# Patient Record
Sex: Male | Born: 1965 | Race: White | Hispanic: No | Smoking: Former smoker
Health system: Southern US, Community
[De-identification: ages and names within clinical notes are randomized; demographics above are authoritative.]

## PROBLEM LIST (undated history)

## (undated) DIAGNOSIS — J45909 Unspecified asthma, uncomplicated: Secondary | ICD-10-CM

## (undated) DIAGNOSIS — I10 Essential (primary) hypertension: Secondary | ICD-10-CM

## (undated) DIAGNOSIS — I739 Peripheral vascular disease, unspecified: Secondary | ICD-10-CM

## (undated) DIAGNOSIS — L89159 Pressure ulcer of sacral region, unspecified stage: Secondary | ICD-10-CM

## (undated) DIAGNOSIS — E119 Type 2 diabetes mellitus without complications: Secondary | ICD-10-CM

## (undated) DIAGNOSIS — N200 Calculus of kidney: Secondary | ICD-10-CM

## (undated) DIAGNOSIS — K859 Acute pancreatitis without necrosis or infection, unspecified: Secondary | ICD-10-CM

## (undated) DIAGNOSIS — F329 Major depressive disorder, single episode, unspecified: Secondary | ICD-10-CM

## (undated) DIAGNOSIS — F419 Anxiety disorder, unspecified: Secondary | ICD-10-CM

## (undated) DIAGNOSIS — E785 Hyperlipidemia, unspecified: Secondary | ICD-10-CM

## (undated) DIAGNOSIS — K219 Gastro-esophageal reflux disease without esophagitis: Secondary | ICD-10-CM

## (undated) DIAGNOSIS — F1010 Alcohol abuse, uncomplicated: Secondary | ICD-10-CM

## (undated) DIAGNOSIS — F4310 Post-traumatic stress disorder, unspecified: Secondary | ICD-10-CM

## (undated) DIAGNOSIS — F32A Depression, unspecified: Secondary | ICD-10-CM

## (undated) DIAGNOSIS — L03211 Cellulitis of face: Secondary | ICD-10-CM

## (undated) DIAGNOSIS — E1110 Type 2 diabetes mellitus with ketoacidosis without coma: Secondary | ICD-10-CM

## (undated) DIAGNOSIS — G2581 Restless legs syndrome: Secondary | ICD-10-CM

## (undated) DIAGNOSIS — E870 Hyperosmolality and hypernatremia: Secondary | ICD-10-CM

## (undated) DIAGNOSIS — I96 Gangrene, not elsewhere classified: Secondary | ICD-10-CM

## (undated) HISTORY — PX: OTHER SURGICAL HISTORY: SHX169

## (undated) HISTORY — PX: CHOLECYSTECTOMY: SHX55

## (undated) HISTORY — PX: COLONOSCOPY: SHX174

## (undated) HISTORY — PX: PANCREATECTOMY: SHX1019

## (undated) HISTORY — PX: INCISION AND DRAINAGE PERIRECTAL ABSCESS: SHX1804

---

## 2013-09-05 ENCOUNTER — Non-Acute Institutional Stay (SKILLED_NURSING_FACILITY): Payer: Medicare Other | Admitting: Internal Medicine

## 2013-09-05 ENCOUNTER — Other Ambulatory Visit: Payer: Self-pay

## 2013-09-05 DIAGNOSIS — K750 Abscess of liver: Secondary | ICD-10-CM

## 2013-09-05 DIAGNOSIS — L8994 Pressure ulcer of unspecified site, stage 4: Secondary | ICD-10-CM

## 2013-09-05 DIAGNOSIS — L8990 Pressure ulcer of unspecified site, unspecified stage: Secondary | ICD-10-CM

## 2013-09-05 DIAGNOSIS — K769 Liver disease, unspecified: Secondary | ICD-10-CM

## 2013-09-05 MED ORDER — LORAZEPAM 0.5 MG PO TABS: 0.5000 mg | ORAL_TABLET | Freq: Three times a day (TID) | ORAL | Status: DC

## 2013-09-05 MED ORDER — TRAMADOL HCL 50 MG PO TABS: 50.0000 mg | ORAL_TABLET | Freq: Three times a day (TID) | ORAL | Status: DC | PRN

## 2013-09-05 NOTE — Progress Notes (Signed)
Patient ID: Florian Chauca, male   DOB: 1966/03/30, 47 y.o.   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 He was apparently in a patient who was transferred possibly from hospital in [place] where the patient lives. He was admitted in July 3 with diabetic ketoacidosis likely triggered by multiple liver abscesses. He developed multiorgan failure requiring endotracheal intubation pressor support and subsequently discharged on August 8 I believe to [facility]. There he had BiPAP dependent respiratory, prolonged IV antibiotics vancomycin, cefepime and metronidazole drains of the liver abscesses were removed the. He was felt to have mild alcohol induced dementia and possible cirrhosis. He apparently was taken to the OR for debridement of a gangrenous necrotic sacral decubitus ulcer on August 14. I am reviewing him today because of this  Physical examination; Skin; there is a substantial stage IV wound over his sacrum. The base of this has some adherent eschar and probably some exposed tendon. I do not feel any bone however there is a considerable amount of undermining several inches towards the left. There is a small satellite wound at roughly 4:00. This has hypergranular the tissue. He has dry gangrene in the tip of his left index finger. There is no infection here the nature of this is not completely clear presumably embolic. Respiratory; clear entry bilaterally no crackles or wheezes Cardiac; heart sounds or tachycardic but no murmurs no gallops he appears to be euvolemic Abdomen; he has a PEG tube in place which apparently they're not using there is a fullness in the right upper quadrant although I could not exactly feel a liver edge. No spleen Neurologic; absent knee jerks are noted he has nonsustained clonus at the left ankle jerk and bilateral extensor plantars. The patient  is tremulous. But I did not note any asterixis Mental status; he remembers nothing about his previous hospitalization or how he got to [facility]. He is aware he is in a nursing home in [place].  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 He should receive santyl the base of this with a moist gauze. 2 the small satellite area I would use silver alginate. Ultimately this may be amenable to a wound VAC. #2 diabetes; the patient states he has been on insulin since his teens therefore I wonder whether he is a type II or type I diabetic although I note that he is on metformin #3 liver abscesses. CT scan done provide a prior to removal of his drains showed the abscess to be gone but a questionable spot in the undersurface of the liver. He has completed his antibiotics Cipro #4 obstructive sleep apnea apparently had a sleep study with results pending

## 2013-09-05 NOTE — Telephone Encounter (Signed)
Verified dose and instructions reflect manual request received by nursing home.   

## 2013-09-08 ENCOUNTER — Non-Acute Institutional Stay (SKILLED_NURSING_FACILITY): Payer: Medicare Other | Admitting: Internal Medicine

## 2013-09-08 ENCOUNTER — Other Ambulatory Visit: Payer: Self-pay | Admitting: *Deleted

## 2013-09-08 DIAGNOSIS — K7030 Alcoholic cirrhosis of liver without ascites: Secondary | ICD-10-CM

## 2013-09-08 DIAGNOSIS — J45909 Unspecified asthma, uncomplicated: Secondary | ICD-10-CM

## 2013-09-08 DIAGNOSIS — E119 Type 2 diabetes mellitus without complications: Secondary | ICD-10-CM

## 2013-09-08 DIAGNOSIS — E7800 Pure hypercholesterolemia, unspecified: Secondary | ICD-10-CM

## 2013-09-08 MED ORDER — LORAZEPAM 0.5 MG PO TABS: 0.5000 mg | ORAL_TABLET | Freq: Three times a day (TID) | ORAL | Status: DC

## 2013-09-10 ENCOUNTER — Emergency Department (HOSPITAL_COMMUNITY)
Admission: EM | Admit: 2013-09-10 | Discharge: 2013-09-10 | Disposition: A | Discharge status: Home / Self Care | Payer: Medicare Other | Attending: Emergency Medicine | Admitting: Emergency Medicine

## 2013-09-10 ENCOUNTER — Encounter (HOSPITAL_COMMUNITY): Payer: Self-pay | Admitting: *Deleted

## 2013-09-10 ENCOUNTER — Emergency Department (HOSPITAL_COMMUNITY): Payer: Medicare Other

## 2013-09-10 DIAGNOSIS — Z7982 Long term (current) use of aspirin: Secondary | ICD-10-CM | POA: Insufficient documentation

## 2013-09-10 DIAGNOSIS — F3289 Other specified depressive episodes: Secondary | ICD-10-CM | POA: Insufficient documentation

## 2013-09-10 DIAGNOSIS — I10 Essential (primary) hypertension: Secondary | ICD-10-CM | POA: Insufficient documentation

## 2013-09-10 DIAGNOSIS — Z8669 Personal history of other diseases of the nervous system and sense organs: Secondary | ICD-10-CM | POA: Insufficient documentation

## 2013-09-10 DIAGNOSIS — N390 Urinary tract infection, site not specified: Secondary | ICD-10-CM | POA: Diagnosis not present

## 2013-09-10 DIAGNOSIS — F411 Generalized anxiety disorder: Secondary | ICD-10-CM | POA: Insufficient documentation

## 2013-09-10 DIAGNOSIS — E1110 Type 2 diabetes mellitus with ketoacidosis without coma: Secondary | ICD-10-CM | POA: Diagnosis not present

## 2013-09-10 DIAGNOSIS — J45909 Unspecified asthma, uncomplicated: Secondary | ICD-10-CM | POA: Insufficient documentation

## 2013-09-10 DIAGNOSIS — E785 Hyperlipidemia, unspecified: Secondary | ICD-10-CM | POA: Insufficient documentation

## 2013-09-10 DIAGNOSIS — R109 Unspecified abdominal pain: Secondary | ICD-10-CM | POA: Diagnosis present

## 2013-09-10 DIAGNOSIS — Z79899 Other long term (current) drug therapy: Secondary | ICD-10-CM | POA: Insufficient documentation

## 2013-09-10 DIAGNOSIS — F329 Major depressive disorder, single episode, unspecified: Secondary | ICD-10-CM | POA: Diagnosis not present

## 2013-09-10 DIAGNOSIS — Z87442 Personal history of urinary calculi: Secondary | ICD-10-CM | POA: Insufficient documentation

## 2013-09-10 DIAGNOSIS — Z872 Personal history of diseases of the skin and subcutaneous tissue: Secondary | ICD-10-CM | POA: Insufficient documentation

## 2013-09-10 DIAGNOSIS — R112 Nausea with vomiting, unspecified: Secondary | ICD-10-CM | POA: Diagnosis not present

## 2013-09-10 DIAGNOSIS — Z794 Long term (current) use of insulin: Secondary | ICD-10-CM | POA: Insufficient documentation

## 2013-09-10 DIAGNOSIS — Z8719 Personal history of other diseases of the digestive system: Secondary | ICD-10-CM | POA: Insufficient documentation

## 2013-09-10 HISTORY — DX: Peripheral vascular disease, unspecified: I73.9

## 2013-09-10 HISTORY — DX: Pressure ulcer of sacral region, unspecified stage: L89.159

## 2013-09-10 HISTORY — DX: Acute pancreatitis without necrosis or infection, unspecified: K85.90

## 2013-09-10 HISTORY — DX: Type 2 diabetes mellitus with ketoacidosis without coma: E11.10

## 2013-09-10 HISTORY — DX: Hyperosmolality and hypernatremia: E87.0

## 2013-09-10 HISTORY — DX: Alcohol abuse, uncomplicated: F10.10

## 2013-09-10 HISTORY — DX: Calculus of kidney: N20.0

## 2013-09-10 HISTORY — DX: Hyperlipidemia, unspecified: E78.5

## 2013-09-10 HISTORY — DX: Type 2 diabetes mellitus without complications: E11.9

## 2013-09-10 HISTORY — DX: Restless legs syndrome: G25.81

## 2013-09-10 HISTORY — DX: Post-traumatic stress disorder, unspecified: F43.10

## 2013-09-10 HISTORY — DX: Major depressive disorder, single episode, unspecified: F32.9

## 2013-09-10 HISTORY — DX: Unspecified asthma, uncomplicated: J45.909

## 2013-09-10 HISTORY — DX: Depression, unspecified: F32.A

## 2013-09-10 HISTORY — DX: Anxiety disorder, unspecified: F41.9

## 2013-09-10 HISTORY — DX: Cellulitis of face: L03.211

## 2013-09-10 HISTORY — DX: Gangrene, not elsewhere classified: I96

## 2013-09-10 HISTORY — DX: Essential (primary) hypertension: I10

## 2013-09-10 HISTORY — DX: Gastro-esophageal reflux disease without esophagitis: K21.9

## 2013-09-10 LAB — URINALYSIS, ROUTINE W REFLEX MICROSCOPIC
Ketones, ur: NEGATIVE mg/dL
Nitrite: NEGATIVE
Protein, ur: NEGATIVE mg/dL
Urobilinogen, UA: 0.2 mg/dL (ref 0.0–1.0)

## 2013-09-10 LAB — CBC WITH DIFFERENTIAL/PLATELET
Basophils Absolute: 0 10*3/uL (ref 0.0–0.1)
Basophils Relative: 0 % (ref 0–1)
Eosinophils Relative: 1 % (ref 0–5)
HCT: 35.6 % — ABNORMAL LOW (ref 39.0–52.0)
Hemoglobin: 12.1 g/dL — ABNORMAL LOW (ref 13.0–17.0)
Lymphocytes Relative: 8 % — ABNORMAL LOW (ref 12–46)
MCHC: 34 g/dL (ref 30.0–36.0)
MCV: 81.3 fL (ref 78.0–100.0)
Monocytes Absolute: 0.7 10*3/uL (ref 0.1–1.0)
Monocytes Relative: 6 % (ref 3–12)
Neutro Abs: 8.8 10*3/uL — ABNORMAL HIGH (ref 1.7–7.7)
RDW: 15.4 % (ref 11.5–15.5)

## 2013-09-10 LAB — COMPREHENSIVE METABOLIC PANEL
BUN: 44 mg/dL — ABNORMAL HIGH (ref 6–23)
CO2: 29 mEq/L (ref 19–32)
Calcium: 10.1 mg/dL (ref 8.4–10.5)
Chloride: 94 mEq/L — ABNORMAL LOW (ref 96–112)
Creatinine, Ser: 1.18 mg/dL (ref 0.50–1.35)
GFR calc non Af Amer: 71 mL/min — ABNORMAL LOW (ref >90)
Total Bilirubin: 1 mg/dL (ref 0.3–1.2)

## 2013-09-10 LAB — LIPASE, BLOOD: Lipase: 12 U/L (ref 11–59)

## 2013-09-10 LAB — URINE MICROSCOPIC-ADD ON

## 2013-09-10 MED ORDER — ONDANSETRON 8 MG PO TBDP: 8.0000 mg | ORAL_TABLET | Freq: Three times a day (TID) | ORAL | Status: DC | PRN

## 2013-09-10 MED ORDER — ONDANSETRON HCL 4 MG/2ML IJ SOLN
4.0000 mg | Freq: Once | INTRAMUSCULAR | Status: AC
  Administered 2013-09-10: 4 mg via INTRAVENOUS
  Filled 2013-09-10: qty 2

## 2013-09-10 MED ORDER — SODIUM CHLORIDE 0.9 % IV SOLN
1000.0000 mL | Freq: Once | INTRAVENOUS | Status: AC
  Administered 2013-09-10: 1000 mL via INTRAVENOUS

## 2013-09-10 MED ORDER — FENTANYL CITRATE 0.05 MG/ML IJ SOLN
50.0000 ug | Freq: Once | INTRAMUSCULAR | Status: AC
  Administered 2013-09-10: 50 ug via INTRAVENOUS
  Filled 2013-09-10: qty 2

## 2013-09-10 MED ORDER — SODIUM CHLORIDE 0.9 % IV SOLN
1000.0000 mL | INTRAVENOUS | Status: DC
  Administered 2013-09-10: 1000 mL via INTRAVENOUS

## 2013-09-10 MED ORDER — CEFUROXIME AXETIL 250 MG PO TABS: 250.0000 mg | ORAL_TABLET | Freq: Two times a day (BID) | ORAL | Status: DC

## 2013-09-10 MED ORDER — DEXTROSE 5 % IV SOLN
1.0000 g | INTRAVENOUS | Status: DC
  Administered 2013-09-10: 1 g via INTRAVENOUS
  Filled 2013-09-10: qty 10

## 2013-09-10 NOTE — ED Provider Notes (Signed)
CSN: [id number]     Arrival date & time 09/10/13  1553 History  First MD Initiated Contact with Patient 09/10/13 1615     Chief Complaint  Patient presents with  . Abdominal Pain  . feeding tube    HPI Patient presents to the emergency room with complaints of abdominal pain. Patient is very complicated medical history. He had been in mid to [facility] in diabetic ketoacidosis. He also had colitis and developed multiple liver abscesses. The patient was intubated and was in the intensive care unit. He developed acute kidney injury, see different healthcare associated pneumonia. Patient was eventually transferred to [facility].  He was recently been transferred to [facility]ursing facility. Starting yesterday the patient began having pain in his left upper abdomen. He has had some episodes of nausea and vomiting as well.  The pain is severe. It does not radiate. He has not noticed any trouble with constipation or diarrhea. He does notice some discomfort when he urinates.  Past Medical History  Diagnosis Date  . ETOH abuse   . Pancreatitis   . Diabetes mellitus without complication   . GERD (gastroesophageal reflux disease)   . Restless leg syndrome   . Anxiety   . Depression   . PTSD (post-traumatic stress disorder)   . Dyslipidemia   . Asthma   . PVD (peripheral vascular disease)   . Facial cellulitis   . DKA (diabetic ketoacidoses)     hx of  . Kidney stones   . Necrotic ulceration of fingers     left index finger  . Hypertension   . Hypernatremia   . Sacral decubitus ulcer    Past Surgical History  Procedure Laterality Date  . Colonoscopy      2008  . Left inguinal hernia repair    . Cholecystectomy    . Incision and drainage perirectal abscess     History reviewed. No pertinent family history. History  Substance Use Topics  . Smoking status: Never Smoker   . Smokeless tobacco: Not on file  . Alcohol Use: Yes     Comment: hx of etoh abuse     Review of Systems  All other systems reviewed and are negative.    Allergies  Dilaudid  Home Medications   Current Outpatient Rx  Name  Route  Sig  Dispense  Refill  . aspirin 81 MG chewable tablet   Oral   Chew 81 mg by mouth daily.         Marland Kitchen atorvastatin (LIPITOR) 10 MG tablet   Oral   Take 10 mg by mouth daily.         . enalapril (VASOTEC) 10 MG tablet   Oral   Take 10 mg by mouth daily.         Marland Kitchen enoxaparin (LOVENOX) 40 MG/0.4ML injection   Subcutaneous   Inject 40 mg into the skin daily.         . feeding supplement (PRO-STAT SUGAR FREE 64) LIQD   Oral   Take 30 mLs by mouth 2 (two) times daily.         . ferrous sulfate 325 (65 FE) MG tablet   Oral   Take 325 mg by mouth daily with breakfast.         . insulin aspart (NOVOLOG) 100 UNIT/ML injection   Subcutaneous   Inject into the skin 3 (three) times daily with meals. 5 units if greater than 150         .  insulin detemir (LEVEMIR) 100 UNIT/ML injection   Subcutaneous   Inject 35 Units into the skin at bedtime.         Marland Kitchen LORazepam (ATIVAN) 0.5 MG tablet   Oral   Take 1 tablet (0.5 mg total) by mouth every 8 (eight) hours.   90 tablet   5   . metFORMIN (GLUCOPHAGE) 500 MG tablet   Oral   Take 500 mg by mouth 2 (two) times daily with a meal.         . metoprolol (LOPRESSOR) 50 MG tablet   Oral   Take 50 mg by mouth 2 (two) times daily.         . Multiple Vitamin (MULTIVITAMIN WITH MINERALS) TABS tablet   Oral   Take 1 tablet by mouth daily.         . QUEtiapine (SEROQUEL) 50 MG tablet   Oral   Take 50 mg by mouth at bedtime.         . Skin Protectants, Misc. (EUCERIN) cream   Topical   Apply 1 application topically as needed for dry skin.         Marland Kitchen thiamine 100 MG tablet   Oral   Take 100 mg by mouth daily.         . vitamin C (ASCORBIC ACID) 500 MG tablet   Oral   Take 500 mg by mouth daily.         Marland Kitchen zinc sulfate 220 MG capsule   Oral   Take 220  mg by mouth daily.         . cefUROXime (CEFTIN) 250 MG tablet   Oral   Take 1 tablet (250 mg total) by mouth 2 (two) times daily.   14 tablet   0   . ondansetron (ZOFRAN ODT) 8 MG disintegrating tablet   Oral   Take 1 tablet (8 mg total) by mouth every 8 (eight) hours as needed for nausea.   20 tablet   0   . traMADol (ULTRAM) 50 MG tablet   Oral   Take 1 tablet (50 mg total) by mouth every 8 (eight) hours as needed for pain.   90 tablet   5    BP 110/70  Pulse 91  Temp(Src) 98 F (36.7 C) (Oral)  Resp 18  SpO2 97% Physical Exam  Nursing note and vitals reviewed. Constitutional: No distress.  Chronically ill-appearing  HENT:  Head: Normocephalic and atraumatic.  Right Ear: External ear normal.  Left Ear: External ear normal.  Eyes: Conjunctivae are normal. Right eye exhibits no discharge. Left eye exhibits no discharge. No scleral icterus.  Neck: Neck supple. No tracheal deviation present.  Cardiovascular: Normal rate, regular rhythm and intact distal pulses.   Pulmonary/Chest: Effort normal and breath sounds normal. No stridor. No respiratory distress. He has no wheezes. He has no rales.  Abdominal: Soft. Bowel sounds are normal. He exhibits no distension. There is tenderness in the left upper quadrant and left lower quadrant. There is no rebound and no guarding.  Musculoskeletal: He exhibits no edema and no tenderness.  Stage IV sacral decubitus ulcer, no purulent drainage, no surrounding erythema  Neurological: He is alert. He has normal strength. He displays tremor. No sensory deficit. Cranial nerve deficit:  no gross defecits noted. He exhibits normal muscle tone. He displays no seizure activity. Coordination normal. GCS eye subscore is 4. GCS verbal subscore is 5. GCS motor subscore is 6.  Generalized weakness  Skin: Skin is  warm and dry. No rash noted.  Psychiatric: He has a normal mood and affect.    ED Course  Procedures (including critical care  time) Labs Review Labs Reviewed  CBC WITH DIFFERENTIAL - Abnormal; Notable for the following:    Hemoglobin 12.1 (*)    HCT 35.6 (*)    Neutrophils Relative % 85 (*)    Neutro Abs 8.8 (*)    Lymphocytes Relative 8 (*)    All other components within normal limits  COMPREHENSIVE METABOLIC PANEL - Abnormal; Notable for the following:    Chloride 94 (*)    Glucose, Bld 172 (*)    BUN 44 (*)    Albumin 3.0 (*)    GFR calc non Af Amer 71 (*)    GFR calc Af Amer 83 (*)    All other components within normal limits  URINALYSIS, ROUTINE W REFLEX MICROSCOPIC - Abnormal; Notable for the following:    Color, Urine AMBER (*)    APPearance TURBID (*)    Hgb urine dipstick SMALL (*)    Leukocytes, UA LARGE (*)    All other components within normal limits  URINE MICROSCOPIC-ADD ON - Abnormal; Notable for the following:    Bacteria, UA FEW (*)    All other components within normal limits  URINE CULTURE  LIPASE, BLOOD   Imaging Review Dg Abd Acute W/chest  09/10/2013   CLINICAL DATA:  Abdominal pain  EXAM: ACUTE ABDOMEN SERIES (ABDOMEN 2 VIEW & CHEST 1 VIEW)  COMPARISON:  None.  FINDINGS: Linear scarring or subsegmental atelectasis in the lung bases. No effusion. Heart size normal.  No free air. Gastrostomy tube projects in the left upper abdomen. Several gas distended left mid abdominal small bowel loops. Moderate fecal material in the proximal colon, decompressed distally. No abnormal abdominal calcifications. Regional bones unremarkable.  IMPRESSION: Nonobstructive bowel gas pattern with moderate proximal colonic fecal material.  Gastrostomy tube projects in expected location.  Linear scar or atelectasis in the lung bases.   Electronically Signed   By: Oley Balm M.D.   On: 09/10/2013 18:12   Medications  0.9 %  sodium chloride infusion (0 mLs Intravenous Stopped 09/10/13 1818)    Followed by  0.9 %  sodium chloride infusion (1,000 mLs Intravenous New Bag/Given 09/10/13 1830)  cefTRIAXone  (ROCEPHIN) 1 g in dextrose 5 % 50 mL IVPB (1 g Intravenous New Bag/Given 09/10/13 1827)  ondansetron (ZOFRAN) injection 4 mg (4 mg Intravenous Given 09/10/13 1655)  fentaNYL (SUBLIMAZE) injection 50 mcg (50 mcg Intravenous Given 09/10/13 1655)    MDM   1. Urinary tract infection    The patient's x-rays did not show any evidence of bowel obstruction. His laboratory tests are consistent with mild dehydration. He was given IV fluids here in the emergency department. Patient's urinalysis show a urinary tract infection. I think this is the cause of his abdominal pain and dysuria.   Patient was given a dose of IV antibiotics. I will discharge him back to the nursing facility with a prescription for Ceftin.    Celene Kras, MD 09/10/13 [phone]

## 2013-09-10 NOTE — ED Notes (Signed)
Bed: WA16 Expected date:  Expected time:  Means of arrival:  Comments: EMS 

## 2013-09-10 NOTE — ED Notes (Addendum)
Per [facility] pt from [facility], room 124. pt reports nausea, vomiting, and abdominal pain since yesterday. Pt reports pain since feeding tube placed in left quadrant 1 week ago (unsure if feeding tube or JP drain). Pt reports foley catheter was removed last week as well. Abdominal pain 10/10. Hx of ETOH, pancreatitis, GERD, anxiety, asthma, PVD, cellulitis. Pt in diaper at present.   Paper work history and physical state pt was admitted to [facility] in DKA, with colitis and multiple liver abscesses. Pt was intubated in ED and eventually extubated. develoepd acute kidney injury, c diff, and healthcare associated pneumonia. JP drain.

## 2013-09-12 ENCOUNTER — Other Ambulatory Visit: Payer: Self-pay | Admitting: *Deleted

## 2013-09-12 LAB — URINE CULTURE: Colony Count: 35000

## 2013-09-12 MED ORDER — LORAZEPAM 0.5 MG PO TABS: 0.5000 mg | ORAL_TABLET | Freq: Three times a day (TID) | ORAL | Status: DC

## 2013-09-15 ENCOUNTER — Non-Acute Institutional Stay (SKILLED_NURSING_FACILITY): Payer: Medicare Other | Admitting: Internal Medicine

## 2013-09-15 DIAGNOSIS — N179 Acute kidney failure, unspecified: Secondary | ICD-10-CM

## 2013-09-15 DIAGNOSIS — D7289 Other specified disorders of white blood cells: Secondary | ICD-10-CM

## 2013-09-15 DIAGNOSIS — D638 Anemia in other chronic diseases classified elsewhere: Secondary | ICD-10-CM

## 2013-09-18 ENCOUNTER — Non-Acute Institutional Stay (SKILLED_NURSING_FACILITY): Payer: Medicare Other | Admitting: Internal Medicine

## 2013-09-18 DIAGNOSIS — N179 Acute kidney failure, unspecified: Secondary | ICD-10-CM

## 2013-09-18 DIAGNOSIS — R630 Anorexia: Secondary | ICD-10-CM

## 2013-09-18 DIAGNOSIS — D638 Anemia in other chronic diseases classified elsewhere: Secondary | ICD-10-CM

## 2013-09-19 DIAGNOSIS — R630 Anorexia: Secondary | ICD-10-CM | POA: Insufficient documentation

## 2013-09-19 DIAGNOSIS — D638 Anemia in other chronic diseases classified elsewhere: Secondary | ICD-10-CM | POA: Insufficient documentation

## 2013-09-19 DIAGNOSIS — N179 Acute kidney failure, unspecified: Secondary | ICD-10-CM | POA: Insufficient documentation

## 2013-09-19 NOTE — Progress Notes (Signed)
PROGRESS NOTE  DATE: 09/18/2013  FACILITY:  [facility]  LEVEL OF CARE: SNF (31)  Acute Visit  CHIEF COMPLAINT:  Manage anorexia, anemia of chronic disease and acute renal failure  HISTORY OF PRESENT ILLNESS: I was requested by the staff to assess the patient regarding above problem(s):  ANOREXIA: Staff reports that patient has no appetite and he is not eating. The patient in is not a new problem. He has a history of alcohol abuse.  ANEMIA: The anemia is unstable. The patient denies melena or hematochezia. No complications from the medications currently being used. On 09-16-13 hemoglobin 10.2, MCV 80. On 09-09-13 hemoglobin 12.5.  ARF: 19-23-14 BUN 45, creatinine 1.29. On 09-09-13 BUN 39, creatinine 1.63. Patient denies confusion or increasing lower extremity swelling.  PAST MEDICAL HISTORY : Reviewed.  No changes.  CURRENT MEDICATIONS: Reviewed per [facility]  REVIEW OF SYSTEMS:  GENERAL: no weight changes, no fever, chills RESPIRATORY: no cough, SOB, DOE,, wheezing, hemoptysis CARDIAC: no chest pain, edema or palpitations GI: no abdominal pain, diarrhea, constipation, heart burn, nausea or vomiting  PHYSICAL EXAMINATION  GENERAL: no acute distress, morbidly obese body habitus EYES: conjunctivae normal, sclerae normal, normal eye lids NECK: supple, trachea midline, no neck masses, no thyroid tenderness, no thyromegaly LYMPHATICS: no LAN in the neck, no supraclavicular LAN RESPIRATORY: breathing is even & unlabored, BS CTAB CARDIAC: RRR, no murmur,no extra heart sounds, no edema GI: abdomen soft, normal BS, no masses, no tenderness, no hepatomegaly, no splenomegaly PSYCHIATRIC: the patient is alert & oriented to person, affect & behavior appropriate  LABS/RADIOLOGY: See history of present illness  ASSESSMENT/PLAN:  Anorexia-unstable problem. Check TSH and liver profile. Obtain dietary consult. May use tube feeds. Anemia of chronic disease-unstable problem.  Hemoglobin decline. Continue iron and monitor. Acute renal failure-renal functions improved.  CPT CODE: 56213

## 2013-09-27 ENCOUNTER — Encounter (HOSPITAL_COMMUNITY): Payer: Self-pay

## 2013-09-27 ENCOUNTER — Emergency Department (HOSPITAL_COMMUNITY): Payer: Medicare Other

## 2013-09-27 ENCOUNTER — Emergency Department (HOSPITAL_COMMUNITY)
Admission: EM | Admit: 2013-09-27 | Discharge: 2013-09-28 | Disposition: A | Discharge status: Home / Self Care | Payer: Medicare Other | Attending: Emergency Medicine | Admitting: Emergency Medicine

## 2013-09-27 DIAGNOSIS — R109 Unspecified abdominal pain: Secondary | ICD-10-CM | POA: Insufficient documentation

## 2013-09-27 DIAGNOSIS — E119 Type 2 diabetes mellitus without complications: Secondary | ICD-10-CM | POA: Insufficient documentation

## 2013-09-27 DIAGNOSIS — F1010 Alcohol abuse, uncomplicated: Secondary | ICD-10-CM | POA: Insufficient documentation

## 2013-09-27 DIAGNOSIS — I96 Gangrene, not elsewhere classified: Secondary | ICD-10-CM

## 2013-09-27 DIAGNOSIS — Z79899 Other long term (current) drug therapy: Secondary | ICD-10-CM | POA: Insufficient documentation

## 2013-09-27 DIAGNOSIS — Z8719 Personal history of other diseases of the digestive system: Secondary | ICD-10-CM | POA: Insufficient documentation

## 2013-09-27 DIAGNOSIS — E875 Hyperkalemia: Secondary | ICD-10-CM | POA: Insufficient documentation

## 2013-09-27 DIAGNOSIS — R110 Nausea: Secondary | ICD-10-CM | POA: Insufficient documentation

## 2013-09-27 DIAGNOSIS — F329 Major depressive disorder, single episode, unspecified: Secondary | ICD-10-CM | POA: Insufficient documentation

## 2013-09-27 DIAGNOSIS — Z87442 Personal history of urinary calculi: Secondary | ICD-10-CM | POA: Insufficient documentation

## 2013-09-27 DIAGNOSIS — Z8659 Personal history of other mental and behavioral disorders: Secondary | ICD-10-CM | POA: Insufficient documentation

## 2013-09-27 DIAGNOSIS — Z7982 Long term (current) use of aspirin: Secondary | ICD-10-CM | POA: Insufficient documentation

## 2013-09-27 DIAGNOSIS — Z87891 Personal history of nicotine dependence: Secondary | ICD-10-CM | POA: Insufficient documentation

## 2013-09-27 DIAGNOSIS — F411 Generalized anxiety disorder: Secondary | ICD-10-CM | POA: Insufficient documentation

## 2013-09-27 DIAGNOSIS — Z8669 Personal history of other diseases of the nervous system and sense organs: Secondary | ICD-10-CM | POA: Insufficient documentation

## 2013-09-27 DIAGNOSIS — Z872 Personal history of diseases of the skin and subcutaneous tissue: Secondary | ICD-10-CM | POA: Insufficient documentation

## 2013-09-27 DIAGNOSIS — Z794 Long term (current) use of insulin: Secondary | ICD-10-CM | POA: Insufficient documentation

## 2013-09-27 DIAGNOSIS — L988 Other specified disorders of the skin and subcutaneous tissue: Secondary | ICD-10-CM | POA: Insufficient documentation

## 2013-09-27 DIAGNOSIS — J45909 Unspecified asthma, uncomplicated: Secondary | ICD-10-CM | POA: Insufficient documentation

## 2013-09-27 DIAGNOSIS — L89109 Pressure ulcer of unspecified part of back, unspecified stage: Secondary | ICD-10-CM | POA: Insufficient documentation

## 2013-09-27 DIAGNOSIS — Z8679 Personal history of other diseases of the circulatory system: Secondary | ICD-10-CM | POA: Insufficient documentation

## 2013-09-27 DIAGNOSIS — E1110 Type 2 diabetes mellitus with ketoacidosis without coma: Secondary | ICD-10-CM | POA: Insufficient documentation

## 2013-09-27 DIAGNOSIS — I10 Essential (primary) hypertension: Secondary | ICD-10-CM | POA: Insufficient documentation

## 2013-09-27 DIAGNOSIS — F3289 Other specified depressive episodes: Secondary | ICD-10-CM | POA: Insufficient documentation

## 2013-09-27 DIAGNOSIS — L89154 Pressure ulcer of sacral region, stage 4: Secondary | ICD-10-CM

## 2013-09-27 DIAGNOSIS — E785 Hyperlipidemia, unspecified: Secondary | ICD-10-CM | POA: Insufficient documentation

## 2013-09-27 LAB — CBC
HCT: 33.6 % — ABNORMAL LOW (ref 39.0–52.0)
MCHC: 33.6 g/dL (ref 30.0–36.0)
Platelets: 242 10*3/uL (ref 150–400)
RDW: 15.2 % (ref 11.5–15.5)
WBC: 9.5 10*3/uL (ref 4.0–10.5)

## 2013-09-27 LAB — POCT I-STAT, CHEM 8
BUN: 26 mg/dL — ABNORMAL HIGH (ref 6–23)
Calcium, Ion: 1.21 mmol/L (ref 1.12–1.23)
Chloride: 102 mEq/L (ref 96–112)
Creatinine, Ser: 1.4 mg/dL — ABNORMAL HIGH (ref 0.50–1.35)
Glucose, Bld: 186 mg/dL — ABNORMAL HIGH (ref 70–99)
HCT: 33 % — ABNORMAL LOW (ref 39.0–52.0)
Sodium: 139 mEq/L (ref 135–145)
TCO2: 29 mmol/L (ref 0–100)

## 2013-09-27 MED ORDER — SODIUM CHLORIDE 0.9 % IV BOLUS (SEPSIS): 1000.0000 mL | Freq: Once | INTRAVENOUS | Status: DC

## 2013-09-27 MED ORDER — ONDANSETRON HCL 4 MG/2ML IJ SOLN
4.0000 mg | Freq: Once | INTRAMUSCULAR | Status: AC
  Administered 2013-09-27: 4 mg via INTRAVENOUS
  Filled 2013-09-27: qty 2

## 2013-09-27 NOTE — ED Notes (Signed)
Pt from [facility] via [facility].  Pt has no complaints.  MD sent him over for follow up regarding a potassium of 7.9 yesterday.  EMS was told that there is a possibility that something was done to the test to make it incorrectly high.  Pt in NAD, alert.

## 2013-09-27 NOTE — ED Provider Notes (Signed)
CSN: [id number]     Arrival date & time 09/27/13  1810 History   First MD Initiated Contact with Patient 09/27/13 1814     Chief Complaint  Patient presents with  . Abnormal Lab   (Consider location/radiation/quality/duration/timing/severity/associated sxs/prior Treatment) The history is provided by the patient. No language interpreter was used.  Graeme Menees is a 47 year old male with past medical history of alcohol abuse, pancreatitis, diabetes, GERD, anxiety, depression, PTSD, DKA, kidney insufficiency, hypertension, dyslipidemia, asthma, PVD brought in by EMS from [facility] nursing home due to abnormal lab. As per patient, reported that nursing home received a phone call from his physician stating that he had an abnormal lab, elevated potassium that was 7.9 yesterday, as per EMS report. Patient reported that he was instructed to come to emergency department to get the lab work redone. Patient reports that he has no complaints. States only complaint he has is that he has discomfort localized to the feeding tube. Patient reports that the feeding tube is cleaned every morning by the nurses, reports that there is pain and drainage coming from the site. Patient reports he been feeling mildly nauseous, reports that this is normal for him, denied vomiting. Patient reports that the pain localized the feeding tube is a sharp pain without radiation. Patient reports that he has noticed blackening of the skin to the tip of his left index finger is been ongoing for the past 3 months - stated that at the nursing home they apply a spray to the site. Patient reports he has an ulcer over by the coccyx region, reports that it is cleaned every day, reported there is pain without radiation, states that he has to move every 4 hours and reposition himself. Denied chest pain, shortness of breath, difficulty breathing, headache, weakness, fever, chills, neck pain, changes to bowel movements, diarrhea, melena, hematochezia,  urinary symptoms, numbness, tingling, dizziness, headaches. PCP patient is unable to recall the name of the primary care provider  Past Medical History  Diagnosis Date  . ETOH abuse   . Pancreatitis   . Diabetes mellitus without complication   . GERD (gastroesophageal reflux disease)   . Restless leg syndrome   . Anxiety   . Depression   . PTSD (post-traumatic stress disorder)   . Dyslipidemia   . Asthma   . PVD (peripheral vascular disease)   . Facial cellulitis   . DKA (diabetic ketoacidoses)     hx of  . Kidney stones   . Necrotic ulceration of fingers     left index finger  . Hypertension   . Hypernatremia   . Sacral decubitus ulcer    Past Surgical History  Procedure Laterality Date  . Colonoscopy      2008  . Left inguinal hernia repair    . Cholecystectomy    . Incision and drainage perirectal abscess    . Pancreatectomy     History reviewed. No pertinent family history. History  Substance Use Topics  . Smoking status: Former Smoker    Quit date: 06/24/2013  . Smokeless tobacco: Never Used  . Alcohol Use: No     Comment: hx of etoh abuse    Review of Systems  Constitutional: Negative for fever and chills.  HENT: Negative for trouble swallowing, neck pain and neck stiffness.   Respiratory: Negative for chest tightness and shortness of breath.   Cardiovascular: Negative for chest pain.  Gastrointestinal: Positive for nausea and abdominal pain (At site of the feeding tube). Negative for  vomiting, diarrhea, constipation and blood in stool.  Genitourinary: Negative for decreased urine volume and difficulty urinating.  Musculoskeletal: Negative for back pain and arthralgias.  Skin: Positive for color change (Necrotic tissue to distal phalanx of left index finger) and wound (Sacral decubitus ulcer, stage IV).  Neurological: Negative for dizziness, weakness, numbness and headaches.  All other systems reviewed and are negative.    Allergies  Dilaudid  Home  Medications   Current Outpatient Rx  Name  Route  Sig  Dispense  Refill  . acetaminophen (TYLENOL) 500 MG tablet   Oral   Take 500 mg by mouth every 4 (four) hours as needed for pain.         Marland Kitchen aspirin 81 MG chewable tablet   Oral   Chew 81 mg by mouth daily.         Marland Kitchen atorvastatin (LIPITOR) 10 MG tablet   Oral   Take 10 mg by mouth daily.         . divalproex (DEPAKOTE SPRINKLE) 125 MG capsule   Oral   Take 250 mg by mouth at bedtime.         . enalapril (VASOTEC) 10 MG tablet   Oral   Take 10 mg by mouth every 12 (twelve) hours.          . enoxaparin (LOVENOX) 40 MG/0.4ML injection   Subcutaneous   Inject 40 mg into the skin daily.         . feeding supplement (PRO-STAT SUGAR FREE 64) LIQD   Oral   Take 30 mLs by mouth 3 (three) times daily.          . ferrous sulfate 325 (65 FE) MG tablet   Oral   Take 325 mg by mouth daily with breakfast.         . insulin detemir (LEVEMIR) 100 UNIT/ML injection   Subcutaneous   Inject 35 Units into the skin at bedtime.         . insulin lispro (HUMALOG) 100 UNIT/ML injection   Subcutaneous   Inject 5 Units into the skin 3 (three) times daily before meals. 5 units >150 cbg         . ipratropium (ATROVENT) 0.02 % nebulizer solution   Nebulization   Take 500 mcg by nebulization every 6 (six) hours as needed for wheezing.         . lactulose (CHRONULAC) 10 GM/15ML solution   Oral   Take 20 g by mouth daily.         Marland Kitchen LORazepam (ATIVAN) 0.5 MG tablet   Oral   Take 1 tablet (0.5 mg total) by mouth every 8 (eight) hours.   90 tablet   5   . metFORMIN (GLUCOPHAGE) 500 MG tablet   Oral   Take 500 mg by mouth 2 (two) times daily with a meal.         . metoCLOPramide (REGLAN) 5 MG tablet   Oral   Take 2.5 mg by mouth 3 (three) times daily.         . metoprolol (LOPRESSOR) 50 MG tablet   Oral   Take 50 mg by mouth 2 (two) times daily.         . Multiple Vitamins-Minerals (CERTAGEN PO)    Oral   Take 1 tablet by mouth daily.         . ondansetron (ZOFRAN ODT) 8 MG disintegrating tablet   Oral   Take 1 tablet (8 mg total) by  mouth every 8 (eight) hours as needed for nausea.   20 tablet   0   . QUEtiapine (SEROQUEL) 50 MG tablet   Oral   Take 50 mg by mouth at bedtime.         . rivastigmine (EXELON) 4.6 mg/24hr   Transdermal   Place 1 patch onto the skin daily.         Marland Kitchen thiamine 100 MG tablet   Oral   Take 100 mg by mouth daily.         . traMADol (ULTRAM) 50 MG tablet   Oral   Take 1 tablet (50 mg total) by mouth every 8 (eight) hours as needed for pain.   90 tablet   5   . vitamin C (ASCORBIC ACID) 500 MG tablet   Oral   Take 500 mg by mouth 2 (two) times daily.          Marland Kitchen zinc sulfate 220 MG capsule   Oral   Take 220 mg by mouth daily.          BP 102/75  Pulse 88  Temp(Src) 98.6 F (37 C) (Oral)  Resp 16  Ht 5\' 8"  (1.727 m)  SpO2 99% Physical Exam  Nursing note and vitals reviewed. Constitutional: He is oriented to person, place, and time. He appears well-developed and well-nourished. No distress.  HENT:  Head: Normocephalic and atraumatic.  Mouth/Throat: Oropharynx is clear and moist. No oropharyngeal exudate.  Poor dentition identified with numerous teeth in decaying process  Eyes: Conjunctivae and EOM are normal. Pupils are equal, round, and reactive to light. Right eye exhibits no discharge. Left eye exhibits no discharge.  Neck: Normal range of motion. Neck supple. No tracheal deviation present.  Negative neck stiffness Negative nuchal rigidity Negative cervical lymphadenopathy  Cardiovascular: Normal rate, regular rhythm and normal heart sounds.  Exam reveals no friction rub.   No murmur heard. Pulses:      Radial pulses are 2+ on the right side, and 2+ on the left side.       Dorsalis pedis pulses are 2+ on the right side, and 2+ on the left side.  Pulmonary/Chest: Effort normal. No respiratory distress. He has no  wheezes. He has rales. He exhibits no tenderness.  Rhonchi and rales noted to the left upper and lower lobes upon auscultation  Abdominal: Soft. Bowel sounds are normal. He exhibits no distension. There is tenderness. There is no rebound and no guarding.  Feeding tube present in left side of abdomen, mild erythema identified encircling the area of insertion of the feeding tube, pus draining from the feeding tube-pain upon palpation to feeding to the area.  Musculoskeletal: Normal range of motion.  Lymphadenopathy:    He has no cervical adenopathy.  Neurological: He is alert and oriented to person, place, and time. No cranial nerve deficit. He exhibits normal muscle tone. Coordination normal. GCS eye subscore is 4. GCS verbal subscore is 5. GCS motor subscore is 6.  Cranial nerves III through XII grossly intact Strength 5+ plus to upper and lower extremities bilaterally with resistance, equal distribution identified  Skin: Skin is warm. No rash noted. He is not diaphoretic.  Necrotic tissue noted to the distal phalanx of the left index finger  Stage IV sacral decubitus ulcer identified, erythema localized to the area surrounding the ulcer. Bandage removed, black noticed to the bandage.  Psychiatric: He has a normal mood and affect. His behavior is normal. Thought content normal.  ED Course  Procedures (including critical care time)  9:23 PM RN spoke with Charge nurse of [facility] - patient came in with sacral decubitous ulcer and necrotic finger when he arrived to the site. Patient has been in [facility] for the past 3 weeks, new resident. As per nursing staff at [facility], patient gets bandages changes an site of feeding tube and sacral decubitous ulcer cleaned everyday.   10:21 PM Patient seen and assessed by Dr. Jinger Neighbors - patient cleared for discharge by Dr. Jinger Neighbors. Physician did not recommend antibiotics, recommended sacral decubitous ulcer and feeding tube to have cleaning  everyday at [facility].    Date: 09/27/2013  Rate: 85  Rhythm: normal sinus rhythm  QRS Axis: normal  Intervals: normal  ST/T Wave abnormalities: normal  Conduction Disutrbances:none  Narrative Interpretation:   Old EKG Reviewed: none available EKG analyzed and reviewed by this provider and attending physician.   Labs Review Labs Reviewed  CBC - Abnormal; Notable for the following:    RBC 4.15 (*)    Hemoglobin 11.3 (*)    HCT 33.6 (*)    All other components within normal limits  POCT I-STAT, CHEM 8 - Abnormal; Notable for the following:    BUN 26 (*)    Creatinine, Ser 1.40 (*)    Glucose, Bld 186 (*)    Hemoglobin 11.2 (*)    HCT 33.0 (*)    All other components within normal limits  POCT I-STAT TROPONIN I   Imaging Review Dg Chest 2 View  09/27/2013   CLINICAL DATA:  Emesis  EXAM: CHEST  2 VIEW  COMPARISON:  09/10/2013  FINDINGS: The heart size and mediastinal contours are within normal limits. Both lungs are clear. The visualized skeletal structures are unremarkable.  IMPRESSION: No active cardiopulmonary disease.   Electronically Signed   By: Alcide Clever M.D.   On: 09/27/2013 20:41    MDM   1. Abnormal labor   2. Decubitus ulcer of coccygeal region, stage IV   3. Necrosis of finger     Patient presenting to emergency department, brought in by EMS, due to abnormal labs. Patient reports that nursing staff at [facility] was called by his primary care provider regarding elevated potassium levels, as per EMS report potassium levels were allegedly 7.9. Patient denied any complaints, reported that he's been having some mild discomfort to his lower back where his sacral decubitus ulcers located as well as area of feeding tube insertion. Denied fever, chills, chest pain, shortness of breath, difficulty breathing, vomiting. Patient alert and oriented. GCS 15. Heart rate and rhythm normal. Pulses palpable and strong, distal and proximal. Lungs good auscultation bilaterally.  Negative pain upon palpation to chest wall. Bowel sounds normal active in all 4 quadrants. Mild discomfort upon palpation to the feeding tube insertion of the left side of the abdomen, when feeding tube pressed yellowish pus drainage identified with mild erythema surrounding the area of insertion of feeding tube. Necrotic tissue of the distal left phalanx. Stage IV sacral decubitus ulcer identified, negative hardening of the tissue, granulation tissue identified, negative drainage or pus drainage identified.  Patient is unable to recall how long he has had his feeding tube. Patient reports she's been having this necrotic tissue to his left distal phalanx for the past 3 months. As per patient reported that he's been having this right applied every single morning by the nursing staff at [facility]. Patient reports he gets feeding tube site and  sacral decubitus ulcer site cleaned everyday by [facility] nursing staff. Necrotic index finger of left hand, feeding tube, and sacral decubitus ulcer all chronic issues.  EKG sinus rhythm identified, negative ischemic findings. First set of troponin negative elevation. Chem-8 with potassium being 5.0, negative hyperkalemia identified. Glucose properly controlled being 186. BUN mildly elevated of 26, creatinine elevated 1.40. Patient currently not in DKA. CBC negative elevation of white blood cell count. Chest x-ray negative for acute cardiopulmonary disease. Patient not in DKA. Negative ischemic findings on EKG - negative elevated troponins. Patient seen and assessed by Dr. Jinger Neighbors - cleared patient for discharge. Negative elevation in potassium when potassium level rechecked in ED setting, suspicion for outpatient potassium level to be hemolyzed. Negative changes to EKG, negative peaked T waves identified. Patient stable, afebrile. Discharged patient. Discussed with patient to continue to have sacral decubitus ulcer and feeding to properly clean daily. Discussed with  patient to continue to take medications at home. Referred patient to wound care clinic. Discussed with patient to continue to monitor symptoms closely and if symptoms are to worsen or change report back to emergency department -strict return instructions given. Patient agreed to plan of care, understood, all questions answered.    Raymon Mutton, PA-C 09/28/13 [phone]

## 2013-09-27 NOTE — ED Notes (Signed)
Marissa, PA is at the bedside.

## 2013-09-27 NOTE — ED Notes (Signed)
Pt has arrived to transport the pt.

## 2013-09-27 NOTE — ED Provider Notes (Signed)
Medical screening examination/treatment/procedure(s) were conducted as a shared visit with non-physician practitioner(s) and myself.  I personally evaluated the patient during the encounter  Chronic dry gangrene of the fingertip, chronic sacral ulcer without surrounding cellulitis or purulent drainage, G-tube insertion site is no erythema no tenderness no obvious purulent drainage just scant yellow drainage suspicious for fibrinous protein rather than infection; feel local wound care reasonable without antibiotics  Hurman Horn, MD 09/28/13 1247

## 2013-09-27 NOTE — ED Notes (Addendum)
Report given to the nursing home. Specified that the dressing on his ulcer needs to be changed every day, and that he needs to [facility] in [facility] days for re-evaluation. Also emphasized that care needs to be taken in regards to dressing changes, and that the pt needs to follow up with the Wound Center for proper care of the ulcer. [facility] has been called.

## 2013-09-27 NOTE — ED Notes (Signed)
The tip of the pt's left index finger is necrotic. The tip of the finger is black, and the tissue beneath the nail is black.

## 2013-09-27 NOTE — ED Notes (Signed)
This RN spoke with the nurse at [facility] about the pt. She reports that the dressing on the pt's pressure ulcer and PEG tube are changed daily by a wound nurse that comes to the nursing home. The nurse also reports that the pt came into the facility with the ulcer and the necrotic tissue on the tip of his finger. The nurse at the facility states that it is on record that both the ulcer dressing and PEG tube dressing have been changed.

## 2013-09-29 ENCOUNTER — Non-Acute Institutional Stay (SKILLED_NURSING_FACILITY): Payer: Medicare Other | Admitting: Internal Medicine

## 2013-09-29 DIAGNOSIS — D638 Anemia in other chronic diseases classified elsewhere: Secondary | ICD-10-CM

## 2013-09-29 DIAGNOSIS — D7289 Other specified disorders of white blood cells: Secondary | ICD-10-CM

## 2013-09-29 DIAGNOSIS — N179 Acute kidney failure, unspecified: Secondary | ICD-10-CM

## 2013-09-29 DIAGNOSIS — E875 Hyperkalemia: Secondary | ICD-10-CM

## 2013-10-04 ENCOUNTER — Non-Acute Institutional Stay (SKILLED_NURSING_FACILITY): Payer: Medicare Other | Admitting: Internal Medicine

## 2013-10-04 DIAGNOSIS — E119 Type 2 diabetes mellitus without complications: Secondary | ICD-10-CM

## 2013-10-04 DIAGNOSIS — L8994 Pressure ulcer of unspecified site, stage 4: Secondary | ICD-10-CM

## 2013-10-04 DIAGNOSIS — L8990 Pressure ulcer of unspecified site, unspecified stage: Secondary | ICD-10-CM

## 2013-10-04 DIAGNOSIS — N179 Acute kidney failure, unspecified: Secondary | ICD-10-CM

## 2013-10-07 NOTE — Progress Notes (Signed)
Patient ID: Christian Banks, male   DOB: [birth year], 47 y.o.   MRN: [id number]        HISTORY & PHYSICAL  DATE: 09/08/2013   FACILITY: [facility]  LEVEL OF CARE: SNF (31)  ALLERGIES:  Allergies  Allergen Reactions  . Dilaudid [Hydromorphone Hcl]     Per MAR    CHIEF COMPLAINT:  Manage diabetes mellitus, cirrhosis, and asthma.    HISTORY OF PRESENT ILLNESS:  The patient is a 47 year-old, Caucasian male who was hospitalized secondary to acute respiratory failure and then, after that, admitted to this facility for short-term rehabilitation.  He has the following problems:    DM:pt's DM remains stable.  Pt denies polyuria, polydipsia, polyphagia, changes in vision or hypoglycemic episodes.  No complications noted from the medication presently being used.  Last hemoglobin A1c is:  Not available.    CIRRHOSIS: The cirrhosis remains stable.  Staff deny increasing confusion.  No complications reported from the medication(s) currently being used.   This is secondary to alcohol abuse.    ASTHMA: The patient's asthma remains stable. Patient denies shortness of breath, dyspnea on exertion or wheezing. No complications reported from the medications currently being used.    PAST MEDICAL HISTORY :  Past Medical History  Diagnosis Date  . ETOH abuse   . Pancreatitis   . Diabetes mellitus without complication   . GERD (gastroesophageal reflux disease)   . Restless leg syndrome   . Anxiety   . Depression   . PTSD (post-traumatic stress disorder)   . Dyslipidemia   . Asthma   . PVD (peripheral vascular disease)   . Facial cellulitis   . DKA (diabetic ketoacidoses)     hx of  . Kidney stones   . Necrotic ulceration of fingers     left index finger  . Hypertension   . Hypernatremia   . Sacral decubitus ulcer     PAST SURGICAL HISTORY: Past Surgical History  Procedure Laterality Date  . Colonoscopy      2008  . Left inguinal hernia repair    . Cholecystectomy    .  Incision and drainage perirectal abscess    . Pancreatectomy     Removal of kidney stones.    Cyst removal from right femur.    SOCIAL HISTORY:  reports that he quit smoking about 3 months ago. He has never used smokeless tobacco. He reports that he does not drink alcohol or use illicit drugs. TOBACCO USE:   The patient is an ongoing smoker of about 1-1/2 packs per day for 20 years.   ALCOHOL:  He is a heavy alcohol drinker for 22 years.    FAMILY HISTORY:  MOTHER:  Mother had diabetes and cancer.   SIBLINGS:  Brother has mental illness.    CURRENT MEDICATIONS: Reviewed per [facility]  REVIEW OF SYSTEMS:  See HPI otherwise 14 point ROS is negative.  PHYSICAL EXAMINATION  VS:  T 98       P 76      RR 16      BP 140/82      POX%        WT (Lb)  GENERAL: no acute distress, moderately obese body habitus EYES: conjunctivae normal, sclerae normal, normal eye lids MOUTH/THROAT: lips without lesions,no lesions in the mouth,tongue is without lesions,uvula elevates in midline NECK: supple, trachea midline, no neck masses, no thyroid tenderness, no thyromegaly LYMPHATICS: no LAN in the neck, no supraclavicular LAN RESPIRATORY: breathing  is even & unlabored, BS CTAB CARDIAC: RRR, no murmur,no extra heart sounds, no edema GI:  ABDOMEN: abdomen soft, normal BS, no masses, no tenderness  LIVER/SPLEEN: no hepatomegaly, no splenomegaly MUSCULOSKELETAL: HEAD: normal to inspection & palpation BACK: no kyphosis, scoliosis or spinal processes tenderness EXTREMITIES: LEFT UPPER EXTREMITY: full range of motion, normal strength & tone RIGHT UPPER EXTREMITY:  full range of motion, normal strength & tone LEFT LOWER EXTREMITY:  full range of motion, normal strength & tone RIGHT LOWER EXTREMITY:  full range of motion, normal strength & tone PSYCHIATRIC: the patient is alert & oriented to person, affect & behavior appropriate  LABS/RADIOLOGY: Hepatitis A negative.  Hepatitis B negative.  Hepatitis C  negative.    Triglycerides 284, LDL 85.    Sodium 139, potassium 4.5, CO2 31, BUN 26, creatinine 0.9, calcium 9.4.    WBC 9.4, hemoglobin 12.1, platelets 249, MCV 83.6.    Ammonia level 85.    C.difficile toxin negative.    Vitamin D level 36.4.    Vitamin A 35.    ASSESSMENT/PLAN:  Diabetes mellitus.  Continue current medications.    Cirrhosis.  Adequately compensated.    Asthma.  Stable.    Hyperlipidemia.  Adequately controlled.    Longstanding alcohol abuse.  Continue thiamine, multivitamin.     Check CBC and BMP.    I have reviewed patient's medical records received at admission/from hospitalization.  CPT CODE: 40981

## 2013-10-08 DIAGNOSIS — E119 Type 2 diabetes mellitus without complications: Secondary | ICD-10-CM | POA: Insufficient documentation

## 2013-10-08 DIAGNOSIS — E7800 Pure hypercholesterolemia, unspecified: Secondary | ICD-10-CM | POA: Insufficient documentation

## 2013-10-08 DIAGNOSIS — K7030 Alcoholic cirrhosis of liver without ascites: Secondary | ICD-10-CM | POA: Insufficient documentation

## 2013-10-08 DIAGNOSIS — J45909 Unspecified asthma, uncomplicated: Secondary | ICD-10-CM | POA: Insufficient documentation

## 2013-10-09 ENCOUNTER — Other Ambulatory Visit (HOSPITAL_COMMUNITY): Payer: Self-pay | Admitting: Internal Medicine

## 2013-10-09 DIAGNOSIS — I639 Cerebral infarction, unspecified: Secondary | ICD-10-CM

## 2013-10-09 DIAGNOSIS — R1310 Dysphagia, unspecified: Secondary | ICD-10-CM

## 2013-10-10 ENCOUNTER — Non-Acute Institutional Stay (SKILLED_NURSING_FACILITY): Payer: Medicare Other | Admitting: Internal Medicine

## 2013-10-10 DIAGNOSIS — L8994 Pressure ulcer of unspecified site, stage 4: Secondary | ICD-10-CM

## 2013-10-10 DIAGNOSIS — L8990 Pressure ulcer of unspecified site, unspecified stage: Secondary | ICD-10-CM

## 2013-10-10 NOTE — Progress Notes (Signed)
Patient ID: Christian Banks, male   DOB: December 25, 1966, 47 y.o.   MRN: [id number] Facility; [facility] Chief complaint followup stage IV coccyx wound. History; this is a patient came to [place] from [facility] transferred there from a hospital in [facility] where he actually lives. His admission to acute care was triggered by diabetic ketoacidosis triggered by multiple liver abscesses. He had a prolonged complicated initial hospitalization. He was BiPAP dependent that when transferred to [facility] and was on IV antibiotics all of this has resolved the. He apparently came to [facility] with a stage IV wound over the sacrum. This required extensive debridement when he came here however this is cleaned up nicely and we applied a wound VAC last week and I am seeing this and followup.  He also has a area of dry gangrene on the The tip of his left index finger. This apparently came with him to [facility] as well according to the patient at least that's what he was been told. We clear. He does not have great radial pulses I have asked for vascular surgery to look at this because of the absence of radial pulses. Been a peripheral emboli at the time of his acute illness. Nevertheless the finger is causing him pain  Physical examination Skin the wound over his coccyx is now granulating nicely although this is a fairly substantial stage IV wound with considerable overhang of the viable skin. The wound VAC should continue however I have cautioned him that this is likely to take months to heal assuming that nothing else intervenes i.e. Infection etc..  Impression/plan  stage IV pressure wound. This looks as though the wound VAC is doing a nice job. The underlying tissue appears healthy although this is still a very deep stage IV ulcer. Wound VAC should continue

## 2013-10-14 ENCOUNTER — Non-Acute Institutional Stay (SKILLED_NURSING_FACILITY): Payer: Medicare Other | Admitting: Internal Medicine

## 2013-10-14 DIAGNOSIS — E119 Type 2 diabetes mellitus without complications: Secondary | ICD-10-CM

## 2013-10-14 DIAGNOSIS — D638 Anemia in other chronic diseases classified elsewhere: Secondary | ICD-10-CM

## 2013-10-14 DIAGNOSIS — F1027 Alcohol dependence with alcohol-induced persisting dementia: Secondary | ICD-10-CM

## 2013-10-14 DIAGNOSIS — E7800 Pure hypercholesterolemia, unspecified: Secondary | ICD-10-CM

## 2013-10-15 ENCOUNTER — Ambulatory Visit (HOSPITAL_COMMUNITY)
Admission: RE | Admit: 2013-10-15 | Discharge: 2013-10-15 | Disposition: A | Discharge status: Home / Self Care | Payer: Medicare Other | Source: Ambulatory Visit | Attending: Internal Medicine | Admitting: Internal Medicine

## 2013-10-15 DIAGNOSIS — R1310 Dysphagia, unspecified: Secondary | ICD-10-CM

## 2013-10-15 DIAGNOSIS — I639 Cerebral infarction, unspecified: Secondary | ICD-10-CM

## 2013-10-15 DIAGNOSIS — F1027 Alcohol dependence with alcohol-induced persisting dementia: Secondary | ICD-10-CM | POA: Insufficient documentation

## 2013-10-15 DIAGNOSIS — R1313 Dysphagia, pharyngeal phase: Secondary | ICD-10-CM | POA: Insufficient documentation

## 2013-10-15 NOTE — Progress Notes (Signed)
PROGRESS NOTE  DATE: 10/14/2013  FACILITY: Nursing Home Location: [facility]  LEVEL OF CARE: SNF (31)  Routine Visit  CHIEF COMPLAINT:  Manage anemia of chronic dz, alcoholic dementia & DM  HISTORY OF PRESENT ILLNESS:  REASSESSMENT OF ONGOING PROBLEM(S):  ANEMIA: The anemia has been stable. The patient denies fatigue, melena or hematochezia. No complications from the medications currently being used.  In 10/14 Hb 11.8, mcv 81.  DEMENTIA: The dementia remaines stable and continues to function adequately in the current living environment with supervision.  The patient has had little changes in behavior. No complications noted from the medications presently being used.  Dementia is thought to be due to alcohol abuse.  DM:pt's DM remains stable.  Pt denies polyuria, polydipsia, polyphagia, changes in vision or hypoglycemic episodes.  No complications noted from the medication presently being used.  Last hemoglobin A1c is 7.1 in 10/14.  PAST MEDICAL HISTORY : Reviewed.  No changes.  CURRENT MEDICATIONS: Reviewed per [facility]  REVIEW OF SYSTEMS:  GENERAL: no change in appetite, no fatigue, no weight changes, no fever, chills or weakness RESPIRATORY: no cough, SOB, DOE, wheezing, hemoptysis CARDIAC: no chest pain, edema or palpitations GI: no abdominal pain, diarrhea, constipation, heart burn, nausea or vomiting  PHYSICAL EXAMINATION  VS:  T 97.6      P 84     RR 18     BP 148/52    POX %     WT (Lb) 207  GENERAL: no acute distress, morbidly body habitus EYES: conjunctivae normal, sclerae normal, normal eye lids NECK: supple, trachea midline, no neck masses, no thyroid tenderness, no thyromegaly LYMPHATICS: no LAN in the neck, no supraclavicular LAN RESPIRATORY: breathing is even & unlabored, BS CTAB CARDIAC: RRR, no murmur,no extra heart sounds, no edema GI: abdomen soft, normal BS, no masses, no tenderness, no hepatomegaly, no splenomegaly.  Has  PEG. PSYCHIATRIC: the patient is alert & oriented to person, affect & behavior appropriate  LABS/RADIOLOGY:  10-14 bun 30, cr 1.44 ow bmp nl, wbc 12.9, anc 10,Hb 11.8, mcv 81, plt 337 9-14 alb 2.9 ow liver profile nl, TSH 2.15  ASSESSMENT/PLAN:  Anemia of chronic dz- Hb stable. DM- well controlled. Alcoholic dementia-check vitamin B12 level Hyperlipidemia-check FLP Anxiety-stable. HTN-BP borderline.  Will review the log. Gastroparesis-cont. Reglan. Check depakote level.  CPT CODE: 78295

## 2013-10-15 NOTE — Progress Notes (Signed)
Patient ID: Christian Banks, male   DOB: 09/01/1966, 47 y.o.   MRN: [id number]           PROGRESS NOTE  DATE:  10/03/2013  FACILITY: [facility]    LEVEL OF CARE:   SNF   Acute Visit   CHIEF COMPLAINT:  Review of coccyx wound and ischemic necrosis on his left index finger.    HISTORY OF PRESENT ILLNESS:  I had seen Christian Banks about a month ago in early September.  He had a large, stage IV wound on his coccyx.  We have been debriding this with Santyl and also very helpful pulse lavage by Physical Therapy.  This has been cleaning up quite nicely but is still a dangerous, stage IV wound with a substantial area over his coccyx and surrounding buttocks.    He also came here with an ischemic area to the left index finger.  Although I did not note this when I initially saw him, he apparently had this in [facility].  The patient tells me that [facility] told him he came with this from the hospital in [place], [place].  The cause of this has never really been clear.    PHYSICAL EXAMINATION:   SKIN:  INSPECTION:   Coccyx:  The wound on his coccyx area is considerably better.  There is still some eschar here.  However, this has cleaned up quite nicely.  There is still undermining which is going to require careful attention.   Left index finger:  The distal aspect of his left index finger has demarcated ischemic necrosis (gangrene).  The patient tells me that his entire finger is painful.  I note that his hands are cold compared to his upper arms.  I cannot feel radial pulses on either side.  Paradoxically, the left biceps pulse is palpable.  The right is palpable, but it is certainly not robust.  As mentioned, I cannot feel radial pulses on either side.   CHEST/RESPIRATORY:  Clear air entry bilaterally.   CARDIOVASCULAR:  CARDIAC:   Heart sounds are normal.  He does not appear to be dehydrated.   GASTROINTESTINAL:  LIVER/SPLEEN/KIDNEYS:  No liver, no spleen.  No tenderness.     ASSESSMENT/PLAN:  Stage IV pressure ulcer.  We are going to need to start a wound VAC on this man.  We can continue with Santyl to the wound underneath the foam and underneath the wound VAC.  I discussed this with the Wound Care team.    Left index finger ischemic gangrene.  The cause of this is not completely clear, but he appears to have vascular issues in his arms.  I briefly explored the issue of whether he could have some vasospastic phenomenon.  He does not give that history.  Nevertheless, I cannot feel radial pulses on either side.  I think this may need to be explored by Vascular Surgery before he is sent to a hand surgeon.    Acute renal failure.  The patient had acute renal failure recently, which seems to have responded to IV fluids.  He is on enalapril 10 mg b.i.d.  He is not on diuretics.  He is on metformin.    Last lab work from 09/30/2013 showed a BUN of 22, a creatinine of 1.27.  His potassium was 5.  Platelet count was 337, white count 12.9.  This was 64 and 3.16 on 09/23/2013.  I am going to reduce the enalapril.    Finally, he has been on Lovenox  ever since he came here.  I think this can stop.  The patient is up and ambulatory.  I do not think this has anything to do with his vascular status.    CPT CODE: 16109

## 2013-10-15 NOTE — Procedures (Signed)
Objective Swallowing Evaluation: Modified Barium Swallowing Study  Patient Details  Name: Christian Banks MRN: [id number] Date of Birth: 1966-07-30  Today's Date: 10/15/2013 Time: 1150-1230 SLP Time Calculation (min): 40 min  Past Medical History:  Past Medical History  Diagnosis Date  . ETOH abuse   . Pancreatitis   . Diabetes mellitus without complication   . GERD (gastroesophageal reflux disease)   . Restless leg syndrome   . Anxiety   . Depression   . PTSD (post-traumatic stress disorder)   . Dyslipidemia   . Asthma   . PVD (peripheral vascular disease)   . Facial cellulitis   . DKA (diabetic ketoacidoses)     hx of  . Kidney stones   . Necrotic ulceration of fingers     left index finger  . Hypertension   . Hypernatremia   . Sacral decubitus ulcer    Past Surgical History:  Past Surgical History  Procedure Laterality Date  . Colonoscopy      2008  . Left inguinal hernia repair    . Cholecystectomy    . Incision and drainage perirectal abscess    . Pancreatectomy     HPI:  47 year old from [facility] SNF for OP MBS with hopes to advance diet.  PMH: Tx'd from a hospital in [place] to [facility] after complicated medical course with respiratory failure, multi-system organ failure, Pancreatitis, cirrohsis, DM, diabetic acidosis, PVD, Hypoxia, CHF, GERD. dysphagia with PEG still in place.     Assessment / Plan / Recommendation Clinical Impression  Dysphagia Diagnosis: Mild pharyngeal phase dysphagia Clinical impression: Pt. currently exhibits a mild pharyngeal dysphagia, characterized by reduced laryngeal elevation, and decreased base of tongue contraction to the posterior pharyngeal wall.  This results in vallecular residue with all consistencies.  Thin and nectar begin to spill into the airway, which pt. does not sense.  With cough on command, most of the penetrates were cleared.  Nectar eventually was aspirated from residue that was not cleared.  Pt. actually had  less penetration with thin liquids, and with a head turn to the right and double swallows (with head still to right) there was no penetration or aspiration.  Pt. is eager to resume a regular diet with thin liquids.  With strict precautions and use of compensatory strategies, this appears to be safe.  The PEG tube may be d/c'd if MD agrees that pt. is meeting his nutritional needs.    Treatment Recommendation  Defer treatment plan to SLP at (Comment) (SNF SLP)    Diet Recommendation Regular;Thin liquid   Liquid Administration via: Cup;No straw Medication Administration: Whole meds with puree Supervision: Patient able to self feed;Full supervision/cueing for compensatory strategies Compensations: Slow rate;Small sips/bites;Multiple dry swallows after each bite/sip;Clear throat intermittently Postural Changes and/or Swallow Maneuvers: Seated upright 90 degrees;Upright 30-60 min after meal;Head turn right during swallow    Other  Recommendations Oral Care Recommendations: Oral care BID Other Recommendations: Clarify dietary restrictions   Follow Up Recommendations  Skilled Nursing facility    Frequency and Duration        Pertinent Vitals/Pain n/a    SLP Swallow Goals  Defer to SNF SLP   General HPI: 47 year old from [facility] SNF for OP MBS with hopes to advance diet.  PMH: Tx'd from a hospital in [place] to [facility] after complicated medical course with respiratory failure, multi-system organ failure, Pancreatitis, cirrohsis, DM, diabetic acidosis, PVD, Hypoxia, CHF, GERD. dysphagia with PEG still in place. Type  of Study: Modified Barium Swallowing Study Reason for Referral: Objectively evaluate swallowing function Previous Swallow Assessment: Unavailable Diet Prior to this Study: Dysphagia 3 (soft);Other (Comment);Nectar-thick liquids (Chopped meats) Temperature Spikes Noted: No Respiratory Status: Room air History of Recent Intubation: No Behavior/Cognition:  Alert;Cooperative (Anxious about "passing this test") Oral Cavity - Dentition: Adequate natural dentition Oral Motor / Sensory Function: Within functional limits Self-Feeding Abilities: Able to feed self Patient Positioning: Upright in chair Baseline Vocal Quality: Breathy;Low vocal intensity Volitional Cough: Strong Anatomy: Within functional limits Pharyngeal Secretions: Not observed secondary MBS    Reason for Referral Objectively evaluate swallowing function   Oral Phase Oral Preparation/Oral Phase Oral Phase: WFL   Pharyngeal Phase Pharyngeal Phase Pharyngeal Phase: Impaired Pharyngeal - Nectar Pharyngeal - Nectar Cup: Reduced laryngeal elevation;Reduced tongue base retraction;Penetration/Aspiration during swallow;Penetration/Aspiration after swallow;Trace aspiration;Pharyngeal residue - valleculae;Compensatory strategies attempted (Comment) (Best with head turned to right) Penetration/Aspiration details (nectar cup): Material enters airway, passes BELOW cords and not ejected out despite cough attempt by patient Pharyngeal - Thin Pharyngeal - Thin Cup: Reduced laryngeal elevation;Reduced tongue base retraction;Penetration/Aspiration during swallow;Trace aspiration;Pharyngeal residue - valleculae (No penetration with head turned to right; double swallow) Penetration/Aspiration details (thin cup): Material enters airway, remains ABOVE vocal cords then ejected out (with cues) Pharyngeal - Solids Pharyngeal - Puree: Reduced laryngeal elevation;Reduced tongue base retraction;Pharyngeal residue - valleculae;Compensatory strategies attempted (Comment) (Repeat dry swallows cleared most residue) Pharyngeal - Mechanical Soft: Reduced tongue base retraction;Reduced laryngeal elevation;Pharyngeal residue - valleculae (Multiple swallows)  Cervical Esophageal Phase    GO         Functional Assessment Tool Used: udgement and videofluoroscopy Functional Limitations: Swallowing Swallow  Current Status ([id number]): At least 20 percent but less than 40 percent impaired, limited or restricted Swallow Goal Status [phone]): At least 20 percent but less than 40 percent impaired, limited or restricted Swallow Discharge Status [id number]): At least 20 percent but less than 40 percent impaired, limited or restricted    Maryjo Rochester T 10/15/2013, 1:36 PM

## 2013-10-16 NOTE — Progress Notes (Signed)
Patient ID: Christian Banks, male   DOB: 02-23-1966, 47 y.o.   MRN: [id number]        PROGRESS NOTE  DATE: 09/15/2013  FACILITY:  [facility]  LEVEL OF CARE: SNF (31)  Acute Visit  CHIEF COMPLAINT:  Manage anemia of chronic disease, acute renal failure, and leukocytosis.    HISTORY OF PRESENT ILLNESS: I was requested by the staff to assess the patient regarding above problem(s):  ACUTE RENAL FAILURE:  On 09/09/2013:  BUN 39, creatinine 1.63.  At hospital discharge:  BUN 26, creatinine 0.9.  Patient denies increasing lower extremity swelling or confusion.    ANEMIA: The anemia has been stable. The patient denies fatigue, melena or hematochezia. The patient is currently not on iron.   On 09/09/2013:  Hemoglobin 12.5, MCV 82.  At hospital discharge:  Hemoglobin 12.1.    LEUKOCYTOSIS:  New problem.  On 09/09/2013:  WBC 12.8.  At hospital discharge:  WBC 9.4.  He denies cough, shortness of breath, suprapubic pain, or dysuria.    PAST MEDICAL HISTORY : Reviewed.  No changes.  CURRENT MEDICATIONS: Reviewed per [facility]  REVIEW OF SYSTEMS:  GENERAL: no change in appetite, no fatigue, no weight changes, no fever, chills or weakness RESPIRATORY: no cough, SOB, DOE,, wheezing, hemoptysis CARDIAC: no chest pain, edema or palpitations GI: no abdominal pain, diarrhea, constipation, heart burn, nausea or vomiting  PHYSICAL EXAMINATION  GENERAL: no acute distress, moderately obese body habitus EYES: conjunctivae normal, sclerae normal, normal eye lids NECK: supple, trachea midline, no neck masses, no thyroid tenderness, no thyromegaly LYMPHATICS: no LAN in the neck, no supraclavicular LAN RESPIRATORY: breathing is even & unlabored, BS CTAB CARDIAC: RRR, no murmur,no extra heart sounds, no edema GI: abdomen soft, normal BS, no masses, no tenderness, no hepatomegaly, no splenomegaly, patient has a PEG   PSYCHIATRIC: the patient is alert & oriented to person, affect & behavior  appropriate  ASSESSMENT/PLAN:  Acute renal failure.  New onset.  Significant problem.  He is currently not on renal toxic medications.  Reassess.    Anemia of chronic disease.  Hemoglobin improved.     Leukocytosis.  New problem.  Patient is asymptomatic.  Therefore, reassess.    CPT CODE: 56213

## 2013-10-17 ENCOUNTER — Non-Acute Institutional Stay (SKILLED_NURSING_FACILITY): Payer: Medicare Other | Admitting: Internal Medicine

## 2013-10-17 DIAGNOSIS — K9429 Other complications of gastrostomy: Secondary | ICD-10-CM

## 2013-10-17 DIAGNOSIS — D7289 Other specified disorders of white blood cells: Secondary | ICD-10-CM | POA: Insufficient documentation

## 2013-10-17 NOTE — Progress Notes (Signed)
Patient ID: Christian Banks, male   DOB: November 02, 1966, 47 y.o.   MRN: [id number] Facility; [facility] Chief complaint; pain and tenderness around the PEG tube site History; Mr. Hamed is a gentleman who came to [place] from [facility] therefore there is a complete absence of meaningful medical information from before his arrival there he came to [place] with a large wound on his coccyx, also a gangrenous area of his left index finger. Furthermore he has a PEG tube which I think was present at the time of his initial acute illness which was secondary to ketoacidosis and multiple liver abscesses. Patient is recently passed a swallowing study and is eating orally fairly well. He claims to have put on 7 pounds this week however over the last several days he has developed pain around his PEG tube site and have been asked to look at this.  On examination; Abdomen the PEG tube site itself does not visibly look too bad there is a scant amount of purulent drainage there is something serious there is a small firm area just cephalad to the actual site itself and more significantly tenderness medially without clear her abscess or cellulitis present. The rest of his abdominal exam seems normal.  Impression/plan #1 gastrostomy tube complication infectious. I have done a culture of this area. Applied Polysporin and a Kerlix gauze to the area daily. The tenderness and firmness around this area that concern me somewhat and I'm going to place him on empiric antibiotics with Levaquin. As he is currently eating 3 meals a day I think all tube feedings can stop and we can see if he can manage himself orally. If he can I think the PEG tube should be removed promptly

## 2013-10-21 ENCOUNTER — Encounter: Payer: Self-pay | Admitting: Vascular Surgery

## 2013-10-22 ENCOUNTER — Encounter: Payer: Self-pay | Admitting: Vascular Surgery

## 2013-10-22 ENCOUNTER — Ambulatory Visit (INDEPENDENT_AMBULATORY_CARE_PROVIDER_SITE_OTHER): Payer: Medicare Other | Admitting: Vascular Surgery

## 2013-10-22 VITALS — BP 95/68 | HR 85 | Temp 98.4°F | Ht 68.0 in | Wt 208.0 lb

## 2013-10-22 DIAGNOSIS — I96 Gangrene, not elsewhere classified: Secondary | ICD-10-CM | POA: Insufficient documentation

## 2013-10-22 NOTE — Progress Notes (Signed)
Patient ID: Christian Banks, male   DOB: Aug 04, 1966, 47 y.o.   MRN: [id number]        PROGRESS NOTE  DATE: 09/29/2013  FACILITY:  [facility]  LEVEL OF CARE: SNF (31)  Acute Visit  CHIEF COMPLAINT:  Manage hyperkalemia, acute renal failure, and leukocytosis.    HISTORY OF PRESENT ILLNESS: I was requested by the staff to assess the patient regarding above problem(s):  HYPERKALEMIA:  New problem.  On 09/26/2013, potassium level was 7.9.  On 09/22/2013, potassium was 5.3.  Patient denies chest pains or palpitations.  The patient is currently not on potassium supplementation.    ACUTE RENAL FAILURE:  On 09/26/2013, patient's BUN was 40, creatinine 1.48.  On 09/22/2013, BUN was 64, creatinine 3.16.  Patient denies increasing lower extremity swelling or confusion.    LEUKOCYTOSIS:  New problem.  On 09/22/2013, WBC was 15.3, ANC 12.4.  On 09/16/2013, WBC was 9.8.  Patient denies cough or shortness of breath, suprapubic pain or flank pain.    PAST MEDICAL HISTORY : Reviewed.  No changes.  CURRENT MEDICATIONS: Reviewed per [facility]  REVIEW OF SYSTEMS:  GENERAL: no change in appetite, no fatigue, no weight changes, no fever, chills or weakness RESPIRATORY: no cough, SOB, DOE,, wheezing, hemoptysis CARDIAC: no chest pain, edema or palpitations GI: no abdominal pain, diarrhea, constipation, heart burn, nausea or vomiting  PHYSICAL EXAMINATION  GENERAL: no acute distress, normal body habitus EYES: conjunctivae normal, sclerae normal, normal eye lids NECK: supple, trachea midline, no neck masses, no thyroid tenderness, no thyromegaly LYMPHATICS: no LAN in the neck, no supraclavicular LAN RESPIRATORY: breathing is even & unlabored, BS CTAB CARDIAC: RRR, no murmur,no extra heart sounds, no edema GI: abdomen soft, normal BS, no masses, no tenderness, no hepatomegaly, no splenomegaly, patient has a PEG   PSYCHIATRIC: the patient is alert & oriented to person, affect & behavior  appropriate  LABS/RADIOLOGY: On 09/22/2013:  Hemoglobin 11.6, MCV 83.    On 09/16/2013:  Hemoglobin 10.2, MCV 80.    On 09/26/2013:  Glucose level 239.    ASSESSMENT/PLAN:  Hyperkalemia.  New onset.  Significant problem.  Give kayexalate 30 g one-time dose.  Reassess in a.m.    Acute renal failure.  New problem, but renal functions improved.  We will reassess.    Anemia of chronic disease.  Hemoglobin improved.     Leukocytosis with left shift.  New problem, but patient asymptomatic.  We will recheck CBC with diff.    Hyperglycemia.  Likely secondary to D5.    CPT CODE: 40981

## 2013-10-22 NOTE — Progress Notes (Signed)
Vascular and Vein Specialist of [facility]  Patient name: Christian Banks MRN: [id number] DOB: 07-04-1966 Sex: male  REASON FOR CONSULT: dry gangrene of left index finger. Referred by Dr. Leanord Hawking.  HPI: Christian Banks is a 47 y.o. male who apparently had a prolonged hospitalization in [place] approximately 6 months ago. He was ultimately transferred to [facility]. He is now in a skilled nursing facility. He states that when he awoke from the coma that he was in, he noted the discoloration of his left index finger. He was sent with dry gangrene of the left index finger. He denies any fever or chills. He denies any significant drainage from the wound. He does not remember any details of the hospitalization in [place].  Other issues include a sacral decubitus and he has a VAC on this. In addition he has a G-tube. Is not being used currently.   Past Medical History  Diagnosis Date  . ETOH abuse   . Pancreatitis   . Diabetes mellitus without complication   . GERD (gastroesophageal reflux disease)   . Restless leg syndrome   . Anxiety   . Depression   . PTSD (post-traumatic stress disorder)   . Dyslipidemia   . Asthma   . PVD (peripheral vascular disease)   . Facial cellulitis   . DKA (diabetic ketoacidoses)     hx of  . Kidney stones   . Necrotic ulceration of fingers     left index finger  . Hypertension   . Hypernatremia   . Sacral decubitus ulcer    No family history on file.  FAMILY HISTORY: He is unaware of any history of premature cardiovascular disease.  SOCIAL HISTORY: History  Substance Use Topics  . Smoking status: Former Smoker    Quit date: 06/24/2013  . Smokeless tobacco: Never Used  . Alcohol Use: No     Comment: hx of etoh abuse   Allergies  Allergen Reactions  . Dilaudid [Hydromorphone Hcl]     Per [facility]   Current Outpatient Prescriptions  Medication Sig Dispense Refill  . acetaminophen (TYLENOL) 500 MG tablet Take 500 mg by mouth every 4 (four)  hours as needed for pain.      Marland Kitchen aspirin 81 MG chewable tablet Chew 81 mg by mouth daily.      Marland Kitchen atorvastatin (LIPITOR) 10 MG tablet Take 10 mg by mouth daily.      . divalproex (DEPAKOTE SPRINKLE) 125 MG capsule Take 250 mg by mouth at bedtime.      . enalapril (VASOTEC) 10 MG tablet Take 10 mg by mouth every 12 (twelve) hours.       . enoxaparin (LOVENOX) 40 MG/0.4ML injection Inject 40 mg into the skin daily.      . feeding supplement (PRO-STAT SUGAR FREE 64) LIQD Take 30 mLs by mouth 3 (three) times daily.       . ferrous sulfate 325 (65 FE) MG tablet Take 325 mg by mouth daily with breakfast.      . insulin detemir (LEVEMIR) 100 UNIT/ML injection Inject 35 Units into the skin at bedtime.      . insulin lispro (HUMALOG) 100 UNIT/ML injection Inject 5 Units into the skin 3 (three) times daily before meals. 5 units >150 cbg      . ipratropium (ATROVENT) 0.02 % nebulizer solution Take 500 mcg by nebulization every 6 (six) hours as needed for wheezing.      . lactulose (CHRONULAC) 10 GM/15ML solution Take 20 g by mouth daily.      Marland Kitchen  LORazepam (ATIVAN) 0.5 MG tablet Take 1 tablet (0.5 mg total) by mouth every 8 (eight) hours.  90 tablet  5  . metFORMIN (GLUCOPHAGE) 500 MG tablet Take 500 mg by mouth 2 (two) times daily with a meal.      . metoCLOPramide (REGLAN) 5 MG tablet Take 2.5 mg by mouth 3 (three) times daily.      . metoprolol (LOPRESSOR) 50 MG tablet Take 50 mg by mouth 2 (two) times daily.      . Multiple Vitamins-Minerals (CERTAGEN PO) Take 1 tablet by mouth daily.      . ondansetron (ZOFRAN ODT) 8 MG disintegrating tablet Take 1 tablet (8 mg total) by mouth every 8 (eight) hours as needed for nausea.  20 tablet  0  . QUEtiapine (SEROQUEL) 50 MG tablet Take 50 mg by mouth at bedtime.      . rivastigmine (EXELON) 4.6 mg/24hr Place 1 patch onto the skin daily.      Marland Kitchen thiamine 100 MG tablet Take 100 mg by mouth daily.      . traMADol (ULTRAM) 50 MG tablet Take 1 tablet (50 mg total) by  mouth every 8 (eight) hours as needed for pain.  90 tablet  5  . vitamin C (ASCORBIC ACID) 500 MG tablet Take 500 mg by mouth 2 (two) times daily.       Marland Kitchen zinc sulfate 220 MG capsule Take 220 mg by mouth daily.       No current facility-administered medications for this visit.   REVIEW OF SYSTEMS: Arly.Keller ] denotes positive finding; [  ] denotes negative finding  CARDIOVASCULAR:  [ ]  chest pain   [ ]  chest pressure   [ ]  palpitations   [ ]  orthopnea   Arly.Keller ] dyspnea on exertion   [ ]  claudication   [ ]  rest pain   [ ]  DVT   [ ]  phlebitis PULMONARY:   [ ]  productive cough   [ ]  asthma   [ ]  wheezing NEUROLOGIC:   [ ]  weakness  [ ]  paresthesias  [ ]  aphasia  [ ]  amaurosis  [ ]  dizziness HEMATOLOGIC:   [ ]  bleeding problems   [ ]  clotting disorders MUSCULOSKELETAL:  [ ]  joint pain   [ ]  joint swelling [ ]  leg swelling GASTROINTESTINAL: [ ]   blood in stool  [ ]   hematemesis GENITOURINARY:  [ ]   dysuria  [ ]   hematuria PSYCHIATRIC:  [ ]  history of major depression INTEGUMENTARY:  [ ]  rashes  [ ]  ulcers CONSTITUTIONAL:  [ ]  fever   [ ]  chills  PHYSICAL EXAM: Filed Vitals:   10/22/13 1326  BP: 95/68  Pulse: 85  Temp: 98.4 F (36.9 C)  TempSrc: Oral  Height: 5\' 8"  (1.727 m)  Weight: 208 lb (94.348 kg)  SpO2: 94%   Body mass index is 31.63 kg/(m^2). GENERAL: The patient is a well-nourished male, in no acute distress. The vital signs are documented above. CARDIOVASCULAR: There is a regular rate and rhythm. I do not detect carotid bruits. On the left side, which is asymptomatic side, he has a palpable ulnar pulse. I cannot palpate a radial pulse. On the right side, he has a palpable radial pulse. I cannot palpate an ulnar pulse on the right. He has palpable femoral pulses and palpable dorsalis pedis pulses bilaterally. He has mild bilateral lower extremity swelling. PULMONARY: There is good air exchange bilaterally without wheezing or rales. ABDOMEN: Soft and non-tender with normal pitched bowel  sounds. He has a G-tube. MUSCULOSKELETAL: he has dry gangrene of the tip of his left index finger with no significant drainage. He has minimal cellulitis around the base of the gangrenous segment. NEUROLOGIC: No focal weakness or paresthesias are detected. SKIN: He has a sacral decubitus with a VAC in place. It appears that he has had a right A-line on the left. There is some small scars present here. Is also some scarring on the dorsum of his right hand point the thumb and index finger. PSYCHIATRIC: The patient has a normal affect.  DATA:  I interrogated with the Doppler. He has a biphasic ulnar signal and palmar arch signal on the left. He has a monophasic radial signal. He has a biphasic right radial and ulnar signal with the Doppler. I am able to get a digital signal with the Doppler in the left index finger.  MEDICAL ISSUES:  DRY GANGRENE OF THE LEFT INDEX FINGER: Based on my exam I think he does have adequate circulation to heal an amputation of the tip of his left index finger. I do not think that an arteriogram is necessary. Although his radial artery may be occluded from his previous A-line, he has normal signals in the ulnar artery and palmar arch with the Doppler flow in the index finger also. Certainly there is some risk of nonhealing given his history of diabetes. If there are any problems in healing and amputation of the finger then certainly we could proceed with an arteriogram. We will refer him to hand surgery for consideration of amputation of the gangrenous segment of his left index finger. Fortunately he states that he has quit smoking since this event. He had previously smoked 1-1/2 packs per day of cigarettes.   Anayiah Howden S [facility] [place] Beeper: [phone]

## 2013-10-28 ENCOUNTER — Non-Acute Institutional Stay (SKILLED_NURSING_FACILITY): Payer: Medicare Other | Admitting: Internal Medicine

## 2013-10-28 DIAGNOSIS — L8990 Pressure ulcer of unspecified site, unspecified stage: Secondary | ICD-10-CM

## 2013-10-28 DIAGNOSIS — I96 Gangrene, not elsewhere classified: Secondary | ICD-10-CM

## 2013-10-28 DIAGNOSIS — L8994 Pressure ulcer of unspecified site, stage 4: Secondary | ICD-10-CM

## 2013-10-29 ENCOUNTER — Other Ambulatory Visit (HOSPITAL_BASED_OUTPATIENT_CLINIC_OR_DEPARTMENT_OTHER): Payer: Self-pay | Admitting: Internal Medicine

## 2013-10-29 DIAGNOSIS — R633 Feeding difficulties: Secondary | ICD-10-CM

## 2013-10-29 DIAGNOSIS — R1310 Dysphagia, unspecified: Secondary | ICD-10-CM

## 2013-10-30 ENCOUNTER — Ambulatory Visit (HOSPITAL_COMMUNITY)
Admission: RE | Admit: 2013-10-30 | Discharge: 2013-10-30 | Disposition: A | Discharge status: Home / Self Care | Payer: Medicare Other | Source: Ambulatory Visit | Attending: Internal Medicine | Admitting: Internal Medicine

## 2013-10-30 DIAGNOSIS — R633 Feeding difficulties: Secondary | ICD-10-CM

## 2013-10-30 DIAGNOSIS — R1310 Dysphagia, unspecified: Secondary | ICD-10-CM

## 2013-10-30 DIAGNOSIS — Z431 Encounter for attention to gastrostomy: Secondary | ICD-10-CM | POA: Insufficient documentation

## 2013-10-30 NOTE — Progress Notes (Addendum)
Successful pull through gastric tube removal. Patient has been eating > 1 month, no longer needed. No immediate complications. Sterile dressing applied. Care instructions given.  Pattricia Boss PA-C Interventional Radiology  10/30/13  10:11 AM

## 2013-10-31 ENCOUNTER — Other Ambulatory Visit: Payer: Self-pay | Admitting: *Deleted

## 2013-10-31 MED ORDER — LORAZEPAM 0.5 MG PO TABS: 0.5000 mg | ORAL_TABLET | Freq: Three times a day (TID) | ORAL | Status: DC

## 2013-11-04 ENCOUNTER — Encounter (HOSPITAL_BASED_OUTPATIENT_CLINIC_OR_DEPARTMENT_OTHER): Payer: Self-pay | Admitting: *Deleted

## 2013-11-04 NOTE — Progress Notes (Signed)
[place] to bring-states he is alert and oriented and able to sign permit

## 2013-11-04 NOTE — Progress Notes (Signed)
Patient ID: Christian Banks, male   DOB: November 05, 1966, 47 y.o.   MRN: [id number]           PROGRESS NOTE  DATE:  10/28/2013  FACILITY: [facility]    LEVEL OF CARE:   SNF   Acute Visit   HISTORY OF PRESENT ILLNESS:  Mr. Dallman is a gentleman who came to [place] from [facility].  Previous to this, he was at a hospital in [place], [place], with hepatic abscesses and a critical care type admission with diabetic ketoacidosis.    He came to [place] with a PEG tube and also, unfortunately, a stage IV pressure ulcer over his coccyx.  This required an extensive period of debridement, both enzymatically and with pulse lavage.  He has had a wound VAC on this for the last few weeks with some interruptions with patient noncompliance.  Nevertheless, the wound VAC is back on and I am reviewing the wound in follow-up.    He has also been to see Vascular Surgery.  They  apparently stated his blood flow to his hand is adequate.   The exact cause of the dry gangrene on the tip of his index finger is not really clear, probably an embolism at the time of his acute illness.  Nevertheless, he is going to a [facility] for an amputation through his PIP.    PHYSICAL EXAMINATION:   SKIN:  INSPECTION:  The wound over his coccyx is dramatic in its improvement.  Whereas there were several centimeters of undermining to the left, this is much improved.  He maybe has 1-2 cm of undermining on the right side of this oval-shaped wound.  However, this is considerably improved.  There is no evidence of infection.    ASSESSMENT/PLAN:  Stage IV pressure ulcer over his coccyx.  We should continue with the wound VAC as long as we can.  The wound is dramatically smaller and the improvement in the undermining is really something to behold.    Gangrene of his left index finger.  He will need an amputation here.  This is not surprising.  I thought he needed to see Vascular Surgery to exclude a more difficult vascular supply  issue, although he does not appear to have any.    Feeding tube.  He is not using this.  He is eating and drinking well.  I think this can be removed.  I will send him to Interventional Radiology.    CPT CODE: 86578

## 2013-11-05 ENCOUNTER — Ambulatory Visit (HOSPITAL_BASED_OUTPATIENT_CLINIC_OR_DEPARTMENT_OTHER): Payer: Medicare Other | Admitting: Anesthesiology

## 2013-11-05 ENCOUNTER — Ambulatory Visit (HOSPITAL_BASED_OUTPATIENT_CLINIC_OR_DEPARTMENT_OTHER)
Admission: RE | Admit: 2013-11-05 | Discharge: 2013-11-05 | Disposition: A | Discharge status: Skilled Nursing Facility | Payer: Medicare Other | Source: Ambulatory Visit | Attending: Orthopedic Surgery | Admitting: Orthopedic Surgery

## 2013-11-05 ENCOUNTER — Encounter (HOSPITAL_BASED_OUTPATIENT_CLINIC_OR_DEPARTMENT_OTHER): Payer: Medicare Other | Admitting: Anesthesiology

## 2013-11-05 ENCOUNTER — Encounter (HOSPITAL_BASED_OUTPATIENT_CLINIC_OR_DEPARTMENT_OTHER): Payer: Self-pay | Admitting: *Deleted

## 2013-11-05 ENCOUNTER — Encounter (HOSPITAL_BASED_OUTPATIENT_CLINIC_OR_DEPARTMENT_OTHER)
Admission: RE | Disposition: A | Discharge status: Skilled Nursing Facility | Payer: Self-pay | Source: Ambulatory Visit | Attending: Orthopedic Surgery

## 2013-11-05 DIAGNOSIS — F430 Acute stress reaction: Secondary | ICD-10-CM | POA: Insufficient documentation

## 2013-11-05 DIAGNOSIS — E870 Hyperosmolality and hypernatremia: Secondary | ICD-10-CM | POA: Insufficient documentation

## 2013-11-05 DIAGNOSIS — E785 Hyperlipidemia, unspecified: Secondary | ICD-10-CM | POA: Insufficient documentation

## 2013-11-05 DIAGNOSIS — Z87891 Personal history of nicotine dependence: Secondary | ICD-10-CM | POA: Insufficient documentation

## 2013-11-05 DIAGNOSIS — F3289 Other specified depressive episodes: Secondary | ICD-10-CM | POA: Insufficient documentation

## 2013-11-05 DIAGNOSIS — K219 Gastro-esophageal reflux disease without esophagitis: Secondary | ICD-10-CM | POA: Insufficient documentation

## 2013-11-05 DIAGNOSIS — J45909 Unspecified asthma, uncomplicated: Secondary | ICD-10-CM | POA: Insufficient documentation

## 2013-11-05 DIAGNOSIS — E119 Type 2 diabetes mellitus without complications: Secondary | ICD-10-CM | POA: Insufficient documentation

## 2013-11-05 DIAGNOSIS — Z794 Long term (current) use of insulin: Secondary | ICD-10-CM | POA: Insufficient documentation

## 2013-11-05 DIAGNOSIS — I10 Essential (primary) hypertension: Secondary | ICD-10-CM | POA: Insufficient documentation

## 2013-11-05 DIAGNOSIS — I739 Peripheral vascular disease, unspecified: Secondary | ICD-10-CM | POA: Insufficient documentation

## 2013-11-05 DIAGNOSIS — F1010 Alcohol abuse, uncomplicated: Secondary | ICD-10-CM | POA: Insufficient documentation

## 2013-11-05 DIAGNOSIS — F329 Major depressive disorder, single episode, unspecified: Secondary | ICD-10-CM | POA: Insufficient documentation

## 2013-11-05 DIAGNOSIS — F411 Generalized anxiety disorder: Secondary | ICD-10-CM | POA: Insufficient documentation

## 2013-11-05 DIAGNOSIS — G2581 Restless legs syndrome: Secondary | ICD-10-CM | POA: Insufficient documentation

## 2013-11-05 DIAGNOSIS — I96 Gangrene, not elsewhere classified: Secondary | ICD-10-CM | POA: Insufficient documentation

## 2013-11-05 HISTORY — PX: AMPUTATION: SHX166

## 2013-11-05 LAB — GLUCOSE, CAPILLARY
Glucose-Capillary: 150 mg/dL — ABNORMAL HIGH (ref 70–99)
Glucose-Capillary: 132 mg/dL — ABNORMAL HIGH (ref 70–99)

## 2013-11-05 LAB — POCT HEMOGLOBIN-HEMACUE: Hemoglobin: 9.5 g/dL — ABNORMAL LOW (ref 13.0–17.0)

## 2013-11-05 SURGERY — AMPUTATION DIGIT
Anesthesia: General | Site: Finger | Laterality: Left | Wound class: Clean

## 2013-11-05 MED ORDER — ONDANSETRON HCL 4 MG/2ML IJ SOLN: 4.0000 mg | Freq: Four times a day (QID) | INTRAMUSCULAR | Status: DC | PRN

## 2013-11-05 MED ORDER — LIDOCAINE HCL (PF) 1 % IJ SOLN
INTRAMUSCULAR | Status: AC
  Filled 2013-11-05: qty 30

## 2013-11-05 MED ORDER — ONDANSETRON HCL 4 MG/2ML IJ SOLN
INTRAMUSCULAR | Status: DC | PRN
  Administered 2013-11-05: 4 mg via INTRAVENOUS

## 2013-11-05 MED ORDER — BUPIVACAINE HCL 0.25 % IJ SOLN
INTRAMUSCULAR | Status: DC | PRN
  Administered 2013-11-05: 9 mL

## 2013-11-05 MED ORDER — OXYCODONE HCL 5 MG/5ML PO SOLN: 5.0000 mg | Freq: Once | ORAL | Status: DC | PRN

## 2013-11-05 MED ORDER — MIDAZOLAM HCL 5 MG/5ML IJ SOLN
INTRAMUSCULAR | Status: DC | PRN
  Administered 2013-11-05: 2 mg via INTRAVENOUS

## 2013-11-05 MED ORDER — CEFAZOLIN SODIUM-DEXTROSE 2-3 GM-% IV SOLR
2.0000 g | INTRAVENOUS | Status: AC
  Administered 2013-11-05: 2 g via INTRAVENOUS

## 2013-11-05 MED ORDER — OXYCODONE-ACETAMINOPHEN 5-325 MG PO TABS: 1.0000 | ORAL_TABLET | ORAL | Status: DC | PRN

## 2013-11-05 MED ORDER — FENTANYL CITRATE 0.05 MG/ML IJ SOLN
INTRAMUSCULAR | Status: DC | PRN
  Administered 2013-11-05 (×2): 50 ug via INTRAVENOUS

## 2013-11-05 MED ORDER — FENTANYL CITRATE 0.05 MG/ML IJ SOLN: 50.0000 ug | INTRAMUSCULAR | Status: DC | PRN

## 2013-11-05 MED ORDER — CEFAZOLIN SODIUM-DEXTROSE 2-3 GM-% IV SOLR
INTRAVENOUS | Status: AC
  Filled 2013-11-05: qty 50

## 2013-11-05 MED ORDER — LACTATED RINGERS IV SOLN
INTRAVENOUS | Status: DC
  Administered 2013-11-05: 20 mL/h via INTRAVENOUS

## 2013-11-05 MED ORDER — MIDAZOLAM HCL 2 MG/2ML IJ SOLN: 1.0000 mg | INTRAMUSCULAR | Status: DC | PRN

## 2013-11-05 MED ORDER — BUPIVACAINE HCL (PF) 0.5 % IJ SOLN
INTRAMUSCULAR | Status: AC
  Filled 2013-11-05: qty 30

## 2013-11-05 MED ORDER — LIDOCAINE HCL (CARDIAC) 20 MG/ML IV SOLN
INTRAVENOUS | Status: DC | PRN
  Administered 2013-11-05: 100 mg via INTRAVENOUS

## 2013-11-05 MED ORDER — MIDAZOLAM HCL 2 MG/2ML IJ SOLN
INTRAMUSCULAR | Status: AC
  Filled 2013-11-05: qty 2

## 2013-11-05 MED ORDER — DOCUSATE SODIUM 100 MG PO CAPS: 100.0000 mg | ORAL_CAPSULE | Freq: Two times a day (BID) | ORAL | Status: DC

## 2013-11-05 MED ORDER — CHLORHEXIDINE GLUCONATE 4 % EX LIQD: 60.0000 mL | Freq: Once | CUTANEOUS | Status: DC

## 2013-11-05 MED ORDER — FENTANYL CITRATE 0.05 MG/ML IJ SOLN
INTRAMUSCULAR | Status: AC
  Filled 2013-11-05: qty 4

## 2013-11-05 MED ORDER — OXYCODONE HCL 5 MG PO TABS: 5.0000 mg | ORAL_TABLET | Freq: Once | ORAL | Status: DC | PRN

## 2013-11-05 MED ORDER — PROPOFOL 10 MG/ML IV BOLUS
INTRAVENOUS | Status: DC | PRN
  Administered 2013-11-05: 200 mg via INTRAVENOUS

## 2013-11-05 MED ORDER — FENTANYL CITRATE 0.05 MG/ML IJ SOLN: 25.0000 ug | INTRAMUSCULAR | Status: DC | PRN

## 2013-11-05 SURGICAL SUPPLY — 46 items
BANDAGE COBAN STERILE 2 (GAUZE/BANDAGES/DRESSINGS) IMPLANT
BANDAGE CONFORM 2  STR LF (GAUZE/BANDAGES/DRESSINGS) IMPLANT
BANDAGE ELASTIC 3 VELCRO ST LF (GAUZE/BANDAGES/DRESSINGS) IMPLANT
BANDAGE GAUZE ELAST BULKY 4 IN (GAUZE/BANDAGES/DRESSINGS) IMPLANT
BENZOIN TINCTURE PRP APPL 2/3 (GAUZE/BANDAGES/DRESSINGS) IMPLANT
BLADE MINI RND TIP GREEN BEAV (BLADE) IMPLANT
BLADE SURG 15 STRL LF DISP TIS (BLADE) ×1 IMPLANT
BLADE SURG 15 STRL SS (BLADE) ×1
BNDG COHESIVE 1X5 TAN STRL LF (GAUZE/BANDAGES/DRESSINGS) IMPLANT
BNDG ESMARK 4X9 LF (GAUZE/BANDAGES/DRESSINGS) ×2 IMPLANT
CORDS BIPOLAR (ELECTRODE) ×2 IMPLANT
COVER MAYO STAND STRL (DRAPES) ×2 IMPLANT
COVER TABLE BACK 60X90 (DRAPES) ×2 IMPLANT
CUFF TOURNIQUET SINGLE 18IN (TOURNIQUET CUFF) ×2 IMPLANT
DECANTER SPIKE VIAL GLASS SM (MISCELLANEOUS) IMPLANT
DRAIN PENROSE 1/2X12 LTX STRL (WOUND CARE) IMPLANT
DRAPE EXTREMITY T 121X128X90 (DRAPE) ×2 IMPLANT
DRAPE SURG 17X23 STRL (DRAPES) ×2 IMPLANT
DRSG EMULSION OIL 3X3 NADH (GAUZE/BANDAGES/DRESSINGS) IMPLANT
GLOVE BIOGEL M 7.0 STRL (GLOVE) ×2 IMPLANT
GLOVE BIOGEL PI IND STRL 8.5 (GLOVE) ×1 IMPLANT
GLOVE BIOGEL PI INDICATOR 8.5 (GLOVE) ×1
GLOVE ECLIPSE 6.5 STRL STRAW (GLOVE) ×2 IMPLANT
GLOVE SURG ORTHO 8.0 STRL STRW (GLOVE) ×2 IMPLANT
GOWN PREVENTION PLUS XLARGE (GOWN DISPOSABLE) ×2 IMPLANT
GOWN PREVENTION PLUS XXLARGE (GOWN DISPOSABLE) ×2 IMPLANT
NEEDLE 27GAX1X1/2 (NEEDLE) IMPLANT
NEEDLE HYPO 25X1 1.5 SAFETY (NEEDLE) IMPLANT
NS IRRIG 1000ML POUR BTL (IV SOLUTION) ×2 IMPLANT
PACK BASIN DAY SURGERY FS (CUSTOM PROCEDURE TRAY) ×2 IMPLANT
PAD CAST 3X4 CTTN HI CHSV (CAST SUPPLIES) IMPLANT
PADDING CAST ABS 4INX4YD NS (CAST SUPPLIES) ×1
PADDING CAST ABS COTTON 4X4 ST (CAST SUPPLIES) ×1 IMPLANT
PADDING CAST COTTON 3X4 STRL (CAST SUPPLIES)
SOAP 2% CHG 32OZ (WOUND CARE) ×2 IMPLANT
STOCKINETTE 4X48 STRL (DRAPES) ×2 IMPLANT
STRIP CLOSURE SKIN 1/2X4 (GAUZE/BANDAGES/DRESSINGS) IMPLANT
SUCTION FRAZIER TIP 10 FR DISP (SUCTIONS) IMPLANT
SUT MERSILENE 4 0 P 3 (SUTURE) IMPLANT
SUT PROLENE 4 0 PS 2 18 (SUTURE) IMPLANT
SYR BULB 3OZ (MISCELLANEOUS) ×2 IMPLANT
SYR CONTROL 10ML LL (SYRINGE) IMPLANT
TOWEL OR 17X24 6PK STRL BLUE (TOWEL DISPOSABLE) ×2 IMPLANT
TRAY DSU PREP LF (CUSTOM PROCEDURE TRAY) ×2 IMPLANT
TUBE CONNECTING 20X1/4 (TUBING) IMPLANT
UNDERPAD 30X30 INCONTINENT (UNDERPADS AND DIAPERS) ×2 IMPLANT

## 2013-11-05 NOTE — Anesthesia Preprocedure Evaluation (Signed)
Anesthesia Evaluation  Patient identified by MRN, date of birth, ID band Patient awake    Reviewed: Allergy & Precautions, H&P , NPO status , Patient's Chart, lab work & pertinent test results  Airway Mallampati: II  Neck ROM: full    Dental   Pulmonary asthma , former smoker,          Cardiovascular hypertension, + Peripheral Vascular Disease     Neuro/Psych Anxiety Depression    GI/Hepatic GERD-  ,(+)     substance abuse  alcohol use, H/o pancreatitis.   Endo/Other  diabetes, Type 2obese  Renal/GU      Musculoskeletal   Abdominal   Peds  Hematology  (+) anemia ,   Anesthesia Other Findings   Reproductive/Obstetrics                           Anesthesia Physical Anesthesia Plan  ASA: III  Anesthesia Plan: General   Post-op Pain Management:    Induction: Intravenous  Airway Management Planned: LMA  Additional Equipment:   Intra-op Plan:   Post-operative Plan:   Informed Consent: I have reviewed the patients History and Physical, chart, labs and discussed the procedure including the risks, benefits and alternatives for the proposed anesthesia with the patient or authorized representative who has indicated his/her understanding and acceptance.     Plan Discussed with: CRNA, Anesthesiologist and Surgeon  Anesthesia Plan Comments:         Anesthesia Quick Evaluation

## 2013-11-05 NOTE — Anesthesia Procedure Notes (Signed)
Procedure Name: LMA Insertion Date/Time: 11/05/2013 1:18 PM Performed by: Caren Macadam Pre-anesthesia Checklist: Patient identified, Emergency Drugs available, Suction available and Patient being monitored Patient Re-evaluated:Patient Re-evaluated prior to inductionOxygen Delivery Method: Circle System Utilized Preoxygenation: Pre-oxygenation with 100% oxygen Intubation Type: IV induction Ventilation: Mask ventilation without difficulty LMA: LMA inserted LMA Size: 4.0 Number of attempts: 1 Airway Equipment and Method: bite block Placement Confirmation: positive ETCO2 and breath sounds checked- equal and bilateral Tube secured with: Tape Dental Injury: Teeth and Oropharynx as per pre-operative assessment

## 2013-11-05 NOTE — Transfer of Care (Signed)
Immediate Anesthesia Transfer of Care Note  Patient: Christian Banks  Procedure(s) Performed: Procedure(s): LEFT INDEX FINGER AMPUTATION THROUGH LEVEL OF PHALANX  (Left)  Patient Location: PACU  Anesthesia Type:General  Level of Consciousness: awake and alert   Airway & Oxygen Therapy: Patient Spontanous Breathing and Patient connected to face mask oxygen  Post-op Assessment: Report given to PACU RN and Post -op Vital signs reviewed and stable  Post vital signs: Reviewed and stable  Complications: No apparent anesthesia complications

## 2013-11-05 NOTE — H&P (Addendum)
Christian Banks is an 47 y.o. male.   Chief Complaint: LEFT index finger pain  HPI: pt with prolonged hospitalization and after return home from hospital pt with finger tip that was black Pt seen and evaluated in office and pt with gangrene,dry, of finger tip Pt here for surgery after consultation/referral from/ with vascular surgeon   Past Medical History  Diagnosis Date  . ETOH abuse   . Pancreatitis   . Diabetes mellitus without complication   . GERD (gastroesophageal reflux disease)   . Restless leg syndrome   . Anxiety   . Depression   . PTSD (post-traumatic stress disorder)   . Dyslipidemia   . Asthma   . PVD (peripheral vascular disease)   . Facial cellulitis   . DKA (diabetic ketoacidoses)     hx of  . Kidney stones   . Necrotic ulceration of fingers     left index finger  . Hypertension   . Hypernatremia   . Sacral decubitus ulcer     Past Surgical History  Procedure Laterality Date  . Colonoscopy      2008  . Left inguinal hernia repair    . Cholecystectomy    . Incision and drainage perirectal abscess    . Pancreatectomy      History reviewed. No pertinent family history. Social History:  reports that he quit smoking about 4 months ago. He has never used smokeless tobacco. He reports that he does not drink alcohol or use illicit drugs.  Allergies:  Allergies  Allergen Reactions  . Dilaudid [Hydromorphone Hcl]     Per [facility]    Medications Prior to Admission  Medication Sig Dispense Refill  . acetaminophen (TYLENOL) 500 MG tablet Take 500 mg by mouth every 4 (four) hours as needed for pain.      Marland Kitchen aspirin 81 MG chewable tablet Chew 81 mg by mouth daily.      Marland Kitchen atorvastatin (LIPITOR) 10 MG tablet Take 10 mg by mouth daily.      . divalproex (DEPAKOTE SPRINKLE) 125 MG capsule Take 250 mg by mouth 2 (two) times daily.       . enalapril (VASOTEC) 10 MG tablet Take 10 mg by mouth every 12 (twelve) hours.       . enoxaparin (LOVENOX) 40 MG/0.4ML injection  Inject 40 mg into the skin daily.      . feeding supplement (PRO-STAT SUGAR FREE 64) LIQD Take 30 mLs by mouth 3 (three) times daily.       . ferrous sulfate 325 (65 FE) MG tablet Take 325 mg by mouth daily with breakfast.      . insulin detemir (LEVEMIR) 100 UNIT/ML injection Inject 35 Units into the skin at bedtime.      . insulin lispro (HUMALOG) 100 UNIT/ML injection Inject 5 Units into the skin 3 (three) times daily before meals. 5 units >150 cbg      . ipratropium (ATROVENT) 0.02 % nebulizer solution Take 500 mcg by nebulization every 6 (six) hours as needed for wheezing.      . lactulose (CHRONULAC) 10 GM/15ML solution Take 20 g by mouth daily.      Marland Kitchen LORazepam (ATIVAN) 0.5 MG tablet Take 1 tablet (0.5 mg total) by mouth every 8 (eight) hours.  90 tablet  5  . metFORMIN (GLUCOPHAGE) 500 MG tablet Take 500 mg by mouth 2 (two) times daily with a meal.      . metoCLOPramide (REGLAN) 5 MG tablet Take 2.5 mg by mouth  3 (three) times daily before meals.       . metoprolol (LOPRESSOR) 50 MG tablet Take 50 mg by mouth 2 (two) times daily.      . Multiple Vitamins-Minerals (CERTAGEN PO) Take 1 tablet by mouth daily.      . ondansetron (ZOFRAN ODT) 8 MG disintegrating tablet Take 1 tablet (8 mg total) by mouth every 8 (eight) hours as needed for nausea.  20 tablet  0  . QUEtiapine (SEROQUEL) 50 MG tablet Take 50 mg by mouth at bedtime.      . rivastigmine (EXELON) 4.6 mg/24hr Place 1 patch onto the skin daily.      Marland Kitchen thiamine 100 MG tablet Take 100 mg by mouth daily.      . traMADol (ULTRAM) 50 MG tablet Take 1 tablet (50 mg total) by mouth every 8 (eight) hours as needed for pain.  90 tablet  5  . vitamin C (ASCORBIC ACID) 500 MG tablet Take 500 mg by mouth 2 (two) times daily.       Marland Kitchen zinc sulfate 220 MG capsule Take 220 mg by mouth daily.        Results for orders placed during the hospital encounter of 11/05/13 (from the past 48 hour(s))  POCT HEMOGLOBIN-HEMACUE     Status: Abnormal    Collection Time    11/05/13 11:42 AM      Result Value Range   Hemoglobin 9.5 (*) 13.0 - 17.0 g/dL  GLUCOSE, CAPILLARY     Status: Abnormal   Collection Time    11/05/13 12:25 PM      Result Value Range   Glucose-Capillary 150 (*) 70 - 99 mg/dL   No results found.  NO history of surgery to right index finger Blood pressure 94/63, pulse 74, temperature 98.3 F (36.8 C), temperature source Oral, resp. rate 20, height 5\' 8"  (1.727 m), weight 98.204 kg (216 lb 8 oz), SpO2 95.00%. General Appearance:  Alert, cooperative, no distress, appears stated age  Head:  Normocephalic, without obvious abnormality, atraumatic  Eyes:  Pupils equal, conjunctiva/corneas clear,         Throat: Lips, mucosa, and tongue normal; teeth and gums normal  Neck: No visible masses     Lungs:   respirations unlabored  Chest Wall:  No tenderness or deformity  Heart:  Regular rate and rhythm,  Abdomen:   Soft, non-tender,         Extremities: LEFT index finger, dead finger tip from just distal to dip joint to tip.  Able to flex dip and pip joint No other finger tips involved Other fingers warm well perfused with good motion  Pulses: 2+ and symmetric  Skin: Skin color, texture, turgor normal, no rashes or lesions     Neurologic: Normal    Assessment/Plan LEFT INDEX FINGER GANGRENE  LEFT INDEX FINGER AMPUTATION WITH LOCAL NEURECTOMIES AND ADVANCEMENT FLAP CLOSURE  R/B/A DISCUSSED WITH PT IN OFFICE.  PT VOICED UNDERSTANDING OF PLAN CONSENT SIGNED DAY OF SURGERY PT SEEN AND EXAMINED PRIOR TO OPERATIVE PROCEDURE/DAY OF SURGERY SITE MARKED. QUESTIONS ANSWERED WILL GO HOME FOLLOWING SURGERY  [name] 11/05/2013, 1:06 PM

## 2013-11-05 NOTE — Anesthesia Postprocedure Evaluation (Signed)
Anesthesia Post Note  Patient: Christian Banks  Procedure(s) Performed: Procedure(s) (LRB): LEFT INDEX FINGER AMPUTATION THROUGH LEVEL OF PHALANX  (Left)  Anesthesia type: General  Patient location: PACU  Post pain: Pain level controlled and Adequate analgesia  Post assessment: Post-op Vital signs reviewed, Patient's Cardiovascular Status Stable, Respiratory Function Stable, Patent Airway and Pain level controlled  Last Vitals:  Filed Vitals:   11/05/13 1443  BP: 114/66  Pulse: 78  Temp: 37.3 C  Resp: 16    Post vital signs: Reviewed and stable  Level of consciousness: awake, alert  and oriented  Complications: No apparent anesthesia complications

## 2013-11-05 NOTE — Brief Op Note (Signed)
11/05/2013  1:09 PM  PATIENT:  Christian Banks  47 y.o. male  PRE-OPERATIVE DIAGNOSIS:  LEFT INDEX FINGER GANGRENE   POST-OPERATIVE DIAGNOSIS:  SAME  PROCEDURE:  Procedure(s): LEFT INDEX FINGER AMPUTATION THROUGH LEVEL OF PHALANX  (Left)  SURGEON:  Surgeon(s) and Role:    * [name], MD - Primary  PHYSICIAN ASSISTANT: NONE  ASSISTANTS: none   ANESTHESIA:   general  EBL:     BLOOD ADMINISTERED:none  DRAINS: none   LOCAL MEDICATIONS USED:  MARCAINE     SPECIMEN:  Source of Specimen:  PATHOLOGY  DISPOSITION OF SPECIMEN:  PATHOLOGY  COUNTS:  YES  TOURNIQUET:  LESS THAN 10 MINUTES  DICTATION: .[id number]  PLAN OF CARE: Discharge to home after PACU  PATIENT DISPOSITION:  PACU - hemodynamically stable.   Delay start of Pharmacological VTE agent (>24hrs) due to surgical blood loss or risk of bleeding: not applicable

## 2013-11-06 ENCOUNTER — Encounter (HOSPITAL_BASED_OUTPATIENT_CLINIC_OR_DEPARTMENT_OTHER): Payer: Self-pay | Admitting: Orthopedic Surgery

## 2013-11-07 NOTE — Op Note (Signed)
Christian Banks, Christian Banks               ACCOUNT NO.:  [id number]  MEDICAL RECORD NO.:  [id number]  LOCATION:                                 FACILITY:  PHYSICIAN:  Madelynn Done, MD  DATE OF BIRTH:  02-27-1966  DATE OF PROCEDURE:  11/05/2013 DATE OF DISCHARGE:  11/05/2013                              OPERATIVE REPORT   PREOPERATIVE DIAGNOSIS:  Left index finger and dry gangrene.  POSTOPERATIVE DIAGNOSIS:  Left index finger and dry gangrene.  ATTENDING PHYSICIAN:  [name] IV, MD who was scrubbed and present for the entire procedure.  ASSISTANT SURGEON:  None.  ANESTHESIA:  General via LMA.  SURGICAL PROCEDURE:  Left index finger through the level of the distal interphalangeal joint with local neurectomies and advancement flap closure.  SURGICAL INDICATIONS:  Mr. Breshears is a right hand-dominant gentleman who had a prolonged hospitalization.  In the hospital and out of the hospital, he was noted to have gangrene to the index finger.  The patient was referred to my office for consultation and amputation given the level how far distal was in the fingertip.  Risks, benefits and alternatives were discussed in detail with the patient and signed informed consent was obtained.  Risks include, but are not limited to bleeding, infection, damage to nearby nerves, arteries, or tendons, phantom pain, loss of motion in the finger and need for further surgical intervention.  DESCRIPTION OF PROCEDURE:  The patient was properly identified in the preoperative holding area and marked with a permanent marker made on the left index finger to indicate the correct operative site.  The patient was then brought back to the operating room, placed supine on the anesthesia room table where general anesthesia was administered.  The patient received preoperative antibiotics prior to skin incision.  A well-padded tourniquet was then placed in the left brachium and sealed with 1000 drape.  The left  upper extremity was then prepped and draped in normal sterile fashion.  Time-out was called.  Correct site was identified and the procedure was then begun.  Attention was then turned to left index finger where a fishmouth incision was made directly over the distal phalanx.  Limb was then elevated.  Tourniquet inflated.  Deep dissection carried down through the flexor sheath the FDP as well as the extensor mechanism.  Sharp dissection was carried down circumferentially to remove the gangrenous at the distal portion.  Following this, completion of the amputation was then done through the level of the distal interphalangeal joint.  The FDP was allowed to retract proximally, making sure not to tap the FDP distally.  Local neurectomies of the radial, ulnar and digital nerves were then carried out with nerve was then brought distally into the incision and resected proximally. The patient did have the deficit volarly.  The skin flaps were then raised and created from a dorsal to volar closure.  The wound was then thoroughly irrigated.  Local advancement flap was then carried out bringing the portion of the healthy dorsal tissue volarly in a V-Y fashion.  The wound was then irrigated.  Copious irrigation done.  The skin was closed with a simple Prolene sutures.  A 90 mL 0.25% Marcaine infiltrated locally.  Adaptic dressing, sterile compressive bandage was then applied.  The patient tolerated the procedure well.  Tourniquet deflated.  Good perfusion of the fingertips and sterile compressive bandage was then applied.  The patient tolerated the procedure well, returned to the recovery room in good condition.  POSTPROCEDURAL PLAN:  The patient was discharged back to his care facility.  We will see him back in the office in 1 week for wound check, sutures out at 2-1/2 to 3-week mark and then begin crutch gradual use and activity as the wound heals.     Madelynn Done, MD     FWO/MEDQ  D:   11/05/2013  T:  11/06/2013  Job:  [id number]

## 2013-11-18 ENCOUNTER — Encounter: Payer: Self-pay | Admitting: Internal Medicine

## 2013-11-18 ENCOUNTER — Non-Acute Institutional Stay (SKILLED_NURSING_FACILITY): Payer: Medicare Other | Admitting: Internal Medicine

## 2013-11-18 DIAGNOSIS — E7800 Pure hypercholesterolemia, unspecified: Secondary | ICD-10-CM

## 2013-11-18 DIAGNOSIS — D638 Anemia in other chronic diseases classified elsewhere: Secondary | ICD-10-CM

## 2013-11-18 DIAGNOSIS — E119 Type 2 diabetes mellitus without complications: Secondary | ICD-10-CM

## 2013-11-18 DIAGNOSIS — F1027 Alcohol dependence with alcohol-induced persisting dementia: Secondary | ICD-10-CM

## 2013-11-18 NOTE — Progress Notes (Signed)
PROGRESS NOTE  DATE: 11-18-13  FACILITY: Nursing Home Location: [facility]  LEVEL OF CARE: SNF (31)  Routine Visit  CHIEF COMPLAINT:  Manage anemia of chronic dz, alcoholic dementia & DM  HISTORY OF PRESENT ILLNESS:  REASSESSMENT OF ONGOING PROBLEM(S):  ANEMIA: The anemia has been stable. The patient denies fatigue, melena or hematochezia. No complications from the medications currently being used.  In 10/14 Hb 11.8, mcv 81.  DEMENTIA: The dementia remaines stable and continues to function adequately in the current living environment with supervision.  The patient has had little changes in behavior. No complications noted from the medications presently being used.  Dementia is thought to be due to alcohol abuse.  DM:pt's DM remains stable.  Pt denies polyuria, polydipsia, polyphagia, changes in vision or hypoglycemic episodes.  No complications noted from the medication presently being used.  Last hemoglobin A1c is 7.1 in 10/14.  PAST MEDICAL HISTORY : Reviewed.  No changes.  CURRENT MEDICATIONS: Reviewed per [facility]  REVIEW OF SYSTEMS:  GENERAL: no change in appetite, no fatigue, no weight changes, no fever, chills or weakness RESPIRATORY: no cough, SOB, DOE, wheezing, hemoptysis CARDIAC: no chest pain, edema or palpitations GI: no abdominal pain, diarrhea, constipation, heart burn, nausea or vomiting  PHYSICAL EXAMINATION  VS:  T 99.2     P 72     RR 18     BP 90/46    POX %     WT (Lb) 197  GENERAL: no acute distress, morbidly body habitus NECK: supple, trachea midline, no neck masses, no thyroid tenderness, no thyromegaly RESPIRATORY: breathing is even & unlabored, BS CTAB CARDIAC: RRR, no murmur,no extra heart sounds, no edema GI: abdomen soft, normal BS, no masses, no tenderness, no hepatomegaly, no splenomegaly.  Has PEG. PSYCHIATRIC: the patient is alert & oriented to person, affect & behavior appropriate  LABS/RADIOLOGY:  10-14 bun 30, cr  1.44 ow bmp nl, wbc 12.9, anc 10,Hb 11.8, mcv 81, plt 337, triglycerides 187, HDL 33, total cholesterol 811, is 36, vitamin B12 level 703, Depakote level 19 9-14 alb 2.9 ow liver profile nl, TSH 2.15  ASSESSMENT/PLAN:  Anemia of chronic dz- Hb stable. DM- well controlled. Alcoholic dementia-stable Hyperlipidemia-well controlled Anxiety-stable. HTN-BP well-controlled Gastroparesis-cont. Reglan.  CPT CODE: 91478

## 2013-12-02 ENCOUNTER — Non-Acute Institutional Stay (SKILLED_NURSING_FACILITY): Payer: Medicare Other | Admitting: Internal Medicine

## 2013-12-02 DIAGNOSIS — F17200 Nicotine dependence, unspecified, uncomplicated: Secondary | ICD-10-CM

## 2013-12-03 ENCOUNTER — Other Ambulatory Visit: Payer: Self-pay | Admitting: *Deleted

## 2013-12-03 MED ORDER — OXYCODONE HCL 10 MG PO TABS: ORAL_TABLET | ORAL | Status: DC

## 2013-12-06 DIAGNOSIS — L8994 Pressure ulcer of unspecified site, stage 4: Secondary | ICD-10-CM

## 2013-12-06 DIAGNOSIS — E119 Type 2 diabetes mellitus without complications: Secondary | ICD-10-CM

## 2013-12-06 DIAGNOSIS — I10 Essential (primary) hypertension: Secondary | ICD-10-CM

## 2013-12-06 DIAGNOSIS — J9620 Acute and chronic respiratory failure, unspecified whether with hypoxia or hypercapnia: Secondary | ICD-10-CM

## 2014-02-04 NOTE — Progress Notes (Signed)
Patient ID: Christian Banks, male   DOB: 07-02-1966, 48 y.o.   MRN: [id number]          PROGRESS NOTE  DATE: 12/02/2013      [facility]  LEVEL OF CARE: SNF (31)  Acute Visit  CHIEF COMPLAINT:  Manage tobacco abuse.    HISTORY OF PRESENT ILLNESS: I was requested by the staff to assess the patient regarding above problem(s):  Patient states that he wants to try a nicotine patch.  He is smoking six or more cigarettes a day.    PAST MEDICAL HISTORY : Reviewed.  No changes.  [facility]  REVIEW OF SYSTEMS:  GENERAL: no change in appetite, no fatigue, no weight changes, no fever, chills or weakness RESPIRATORY: no cough, SOB, DOE,, wheezing, hemoptysis CARDIAC: no chest pain, edema or palpitations GI: no abdominal pain, diarrhea, constipation, heart burn, nausea or vomiting  PHYSICAL EXAMINATION  GENERAL: no acute distress, normal body habitus NECK: supple, trachea midline, no neck masses, no thyroid tenderness, no thyromegaly RESPIRATORY: breathing is even & unlabored, BS CTAB CARDIAC: RRR, no murmur,no extra heart sounds, no edema GI: abdomen soft, normal BS, no masses, no tenderness, no hepatomegaly, no splenomegaly PSYCHIATRIC: the patient is alert & oriented to person, affect & behavior appropriate  ASSESSMENT/PLAN:  Tobacco abuse.  Ongoing problem.  Start nicotine patch 21 mg q.d. for four weeks, then decrease to 14 mg q.d. for two weeks, and then discontinue.    THN Metrics:   Positive tobacco use.  Aspirin 81 mg.  BP:  90/46.    CPT CODE: [id number]

## 2014-02-05 ENCOUNTER — Encounter: Payer: Self-pay | Admitting: Internal Medicine

## 2014-02-05 DIAGNOSIS — F17200 Nicotine dependence, unspecified, uncomplicated: Secondary | ICD-10-CM | POA: Insufficient documentation

## 2015-02-21 IMAGING — CR DG ABDOMEN ACUTE W/ 1V CHEST
3 series · 3 of 3 positions shown · non-contrast
Comparison: None.

CLINICAL DATA: Abdominal pain

EXAM:
ACUTE ABDOMEN SERIES (ABDOMEN 2 VIEW & CHEST 1 VIEW)

[w abdomen upright]
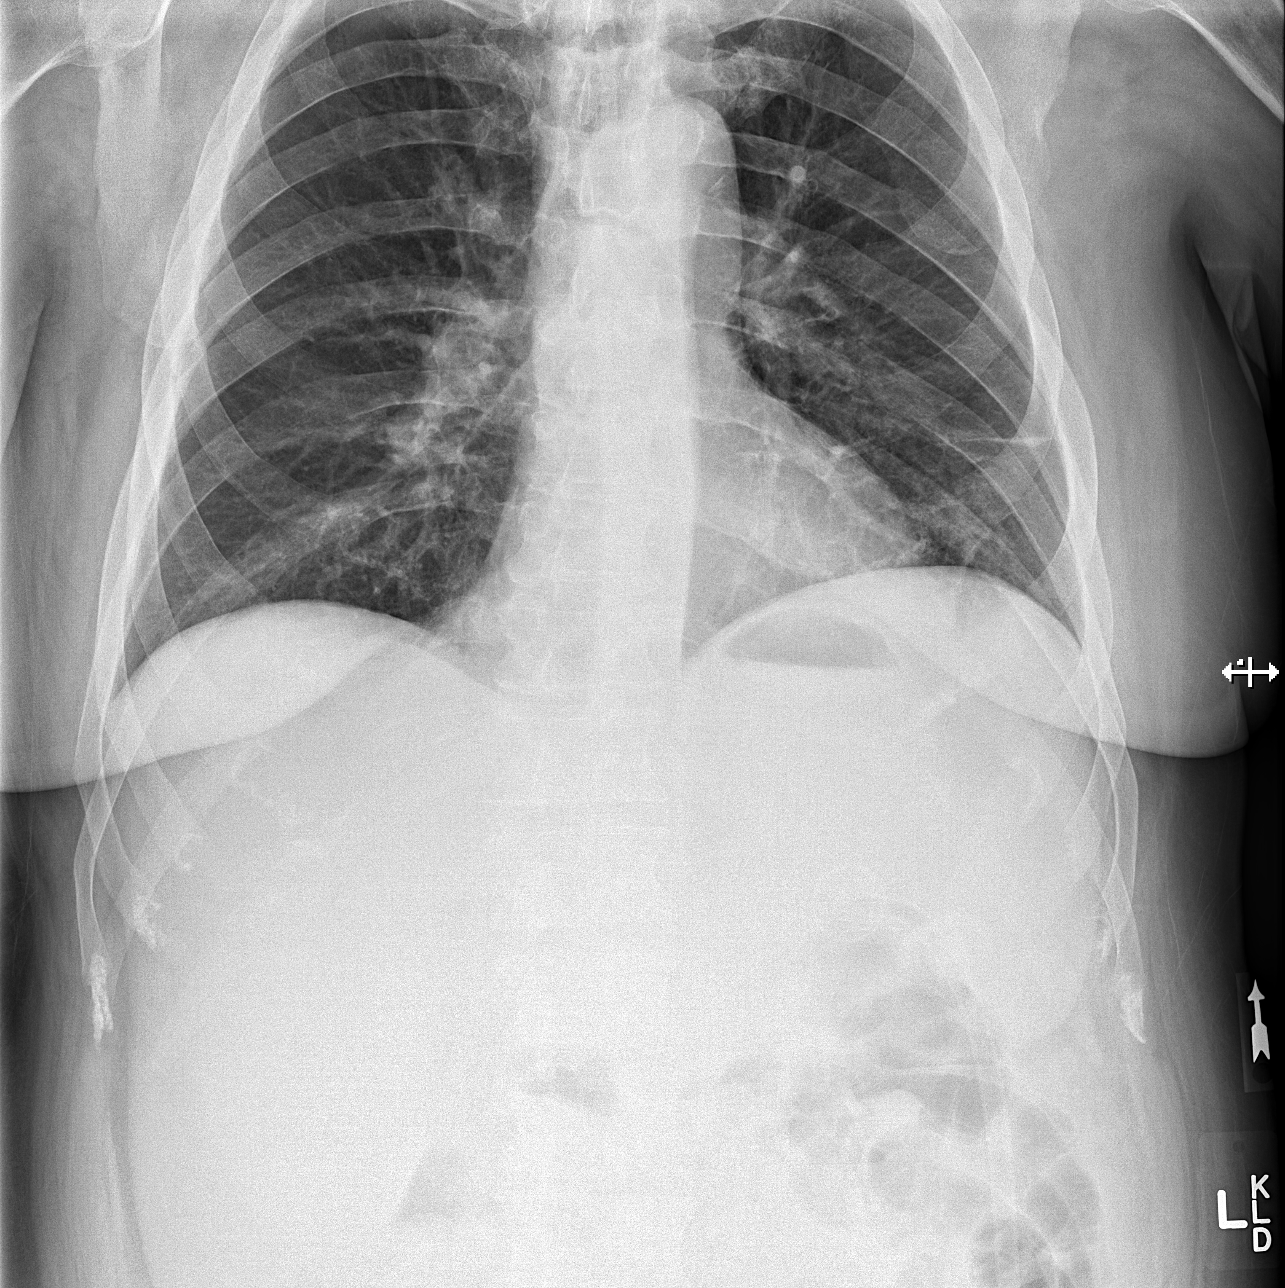

[w chest pa]
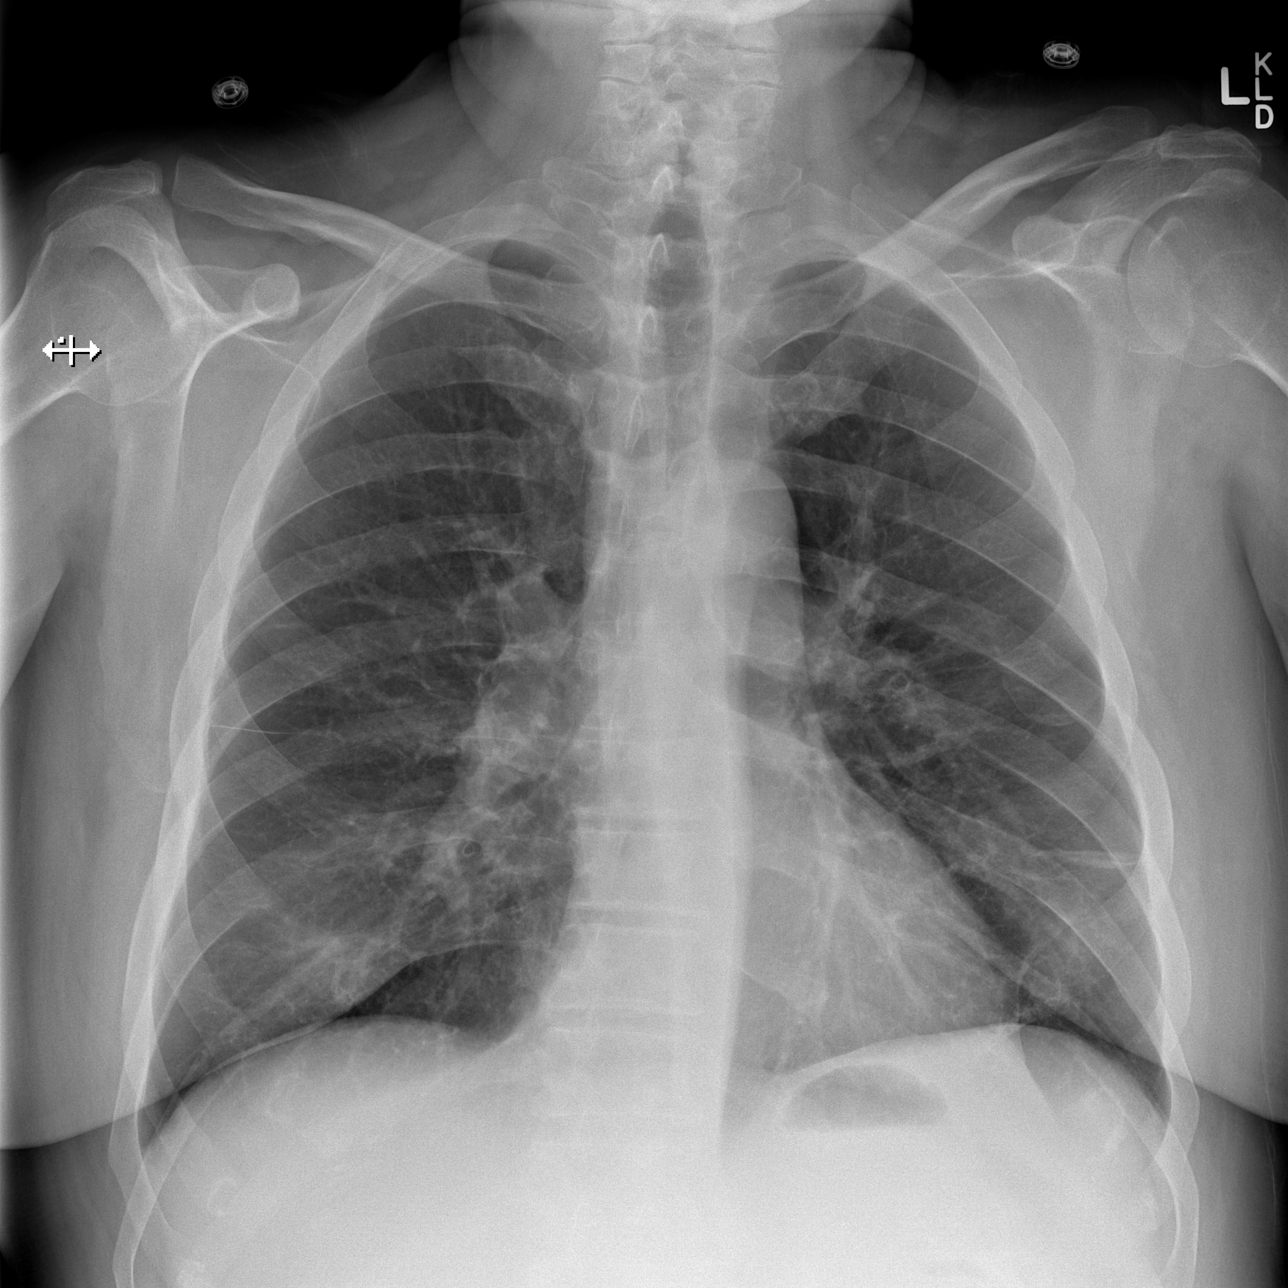

[t abdomen supine]
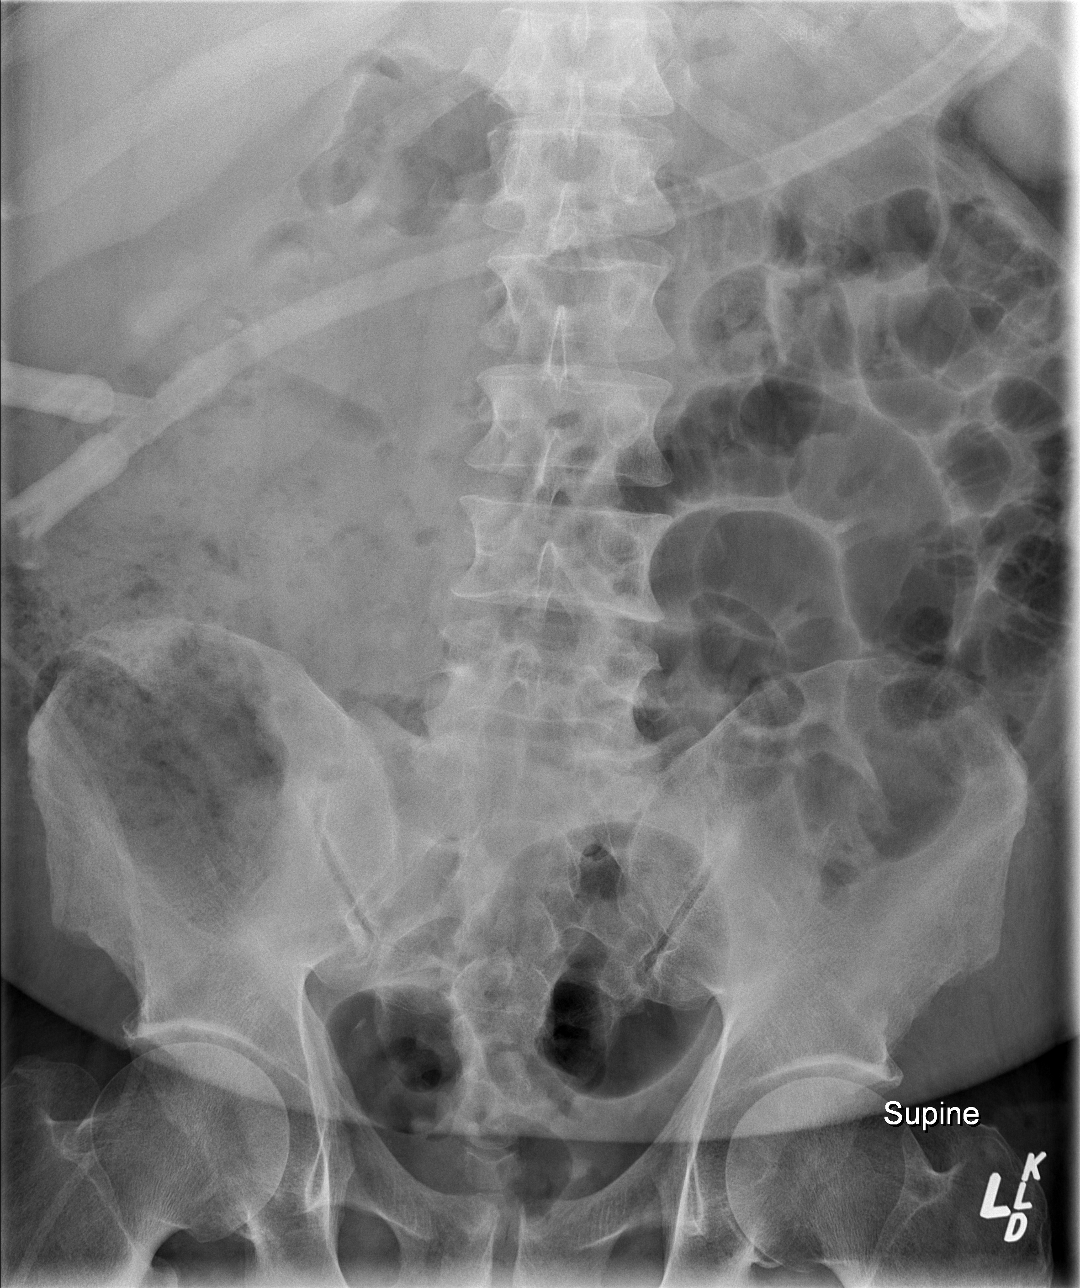

[3 of 3 positions shown; findings below may reference images not displayed]

FINDINGS: Linear scarring or subsegmental atelectasis in the lung bases. No
effusion. Heart size normal.

No free air. Gastrostomy tube projects in the left upper abdomen.
Several gas distended left mid abdominal small bowel loops. Moderate
fecal material in the proximal colon, decompressed distally. No
abnormal abdominal calcifications. Regional bones unremarkable.
IMPRESSION: Nonobstructive bowel gas pattern with moderate proximal colonic
fecal material.

Gastrostomy tube projects in expected location.

Linear scar or atelectasis in the lung bases.

## 2015-11-25 DEATH — deceased
